# Patient Record
Sex: Female | Born: 1935 | Race: White | Hispanic: No | Marital: Single | Smoking: Former smoker
Health system: Southern US, Community
[De-identification: ages and names within clinical notes are randomized; demographics above are authoritative.]

## PROBLEM LIST (undated history)

## (undated) DIAGNOSIS — M810 Age-related osteoporosis without current pathological fracture: Secondary | ICD-10-CM

## (undated) DIAGNOSIS — J189 Pneumonia, unspecified organism: Secondary | ICD-10-CM

## (undated) DIAGNOSIS — N321 Vesicointestinal fistula: Secondary | ICD-10-CM

## (undated) DIAGNOSIS — M1990 Unspecified osteoarthritis, unspecified site: Secondary | ICD-10-CM

## (undated) DIAGNOSIS — N390 Urinary tract infection, site not specified: Secondary | ICD-10-CM

## (undated) DIAGNOSIS — J309 Allergic rhinitis, unspecified: Secondary | ICD-10-CM

## (undated) DIAGNOSIS — K5090 Crohn's disease, unspecified, without complications: Secondary | ICD-10-CM

## (undated) DIAGNOSIS — I739 Peripheral vascular disease, unspecified: Secondary | ICD-10-CM

## (undated) DIAGNOSIS — Z1639 Resistance to other specified antimicrobial drug: Secondary | ICD-10-CM

## (undated) DIAGNOSIS — R197 Diarrhea, unspecified: Secondary | ICD-10-CM

## (undated) DIAGNOSIS — I824Z9 Acute embolism and thrombosis of unspecified deep veins of unspecified distal lower extremity: Secondary | ICD-10-CM

## (undated) DIAGNOSIS — I10 Essential (primary) hypertension: Secondary | ICD-10-CM

## (undated) DIAGNOSIS — F0390 Unspecified dementia without behavioral disturbance: Secondary | ICD-10-CM

## (undated) DIAGNOSIS — K5900 Constipation, unspecified: Secondary | ICD-10-CM

## (undated) DIAGNOSIS — R34 Anuria and oliguria: Secondary | ICD-10-CM

## (undated) DIAGNOSIS — A0472 Enterocolitis due to Clostridium difficile, not specified as recurrent: Secondary | ICD-10-CM

## (undated) DIAGNOSIS — K219 Gastro-esophageal reflux disease without esophagitis: Secondary | ICD-10-CM

## (undated) DIAGNOSIS — D649 Anemia, unspecified: Secondary | ICD-10-CM

## (undated) DIAGNOSIS — A419 Sepsis, unspecified organism: Secondary | ICD-10-CM

## (undated) DIAGNOSIS — I872 Venous insufficiency (chronic) (peripheral): Secondary | ICD-10-CM

## (undated) HISTORY — PX: EYE SURGERY: SHX253

---

## 2004-12-07 ENCOUNTER — Ambulatory Visit: Payer: Self-pay | Admitting: Internal Medicine

## 2005-05-31 ENCOUNTER — Ambulatory Visit: Payer: Self-pay | Admitting: Internal Medicine

## 2005-11-30 ENCOUNTER — Ambulatory Visit: Payer: Self-pay | Admitting: Internal Medicine

## 2006-06-06 ENCOUNTER — Ambulatory Visit: Payer: Self-pay | Admitting: Internal Medicine

## 2006-11-14 ENCOUNTER — Inpatient Hospital Stay (HOSPITAL_COMMUNITY): Admission: EM | Admit: 2006-11-14 | Discharge: 2006-11-17 | Payer: Self-pay | Admitting: Emergency Medicine

## 2006-11-14 ENCOUNTER — Ambulatory Visit: Payer: Self-pay | Admitting: Cardiology

## 2006-11-14 ENCOUNTER — Ambulatory Visit: Payer: Self-pay | Admitting: Internal Medicine

## 2006-12-25 ENCOUNTER — Encounter: Payer: Self-pay | Admitting: Vascular Surgery

## 2006-12-25 ENCOUNTER — Ambulatory Visit (HOSPITAL_COMMUNITY): Admission: RE | Admit: 2006-12-25 | Discharge: 2006-12-25 | Payer: Self-pay | Admitting: Internal Medicine

## 2007-01-27 ENCOUNTER — Ambulatory Visit: Payer: Self-pay | Admitting: Family Medicine

## 2007-01-29 ENCOUNTER — Ambulatory Visit: Payer: Self-pay | Admitting: Internal Medicine

## 2007-01-29 LAB — CONVERTED CEMR LAB
Calcium: 9.6 mg/dL (ref 8.4–10.5)
Eosinophils Absolute: 0.1 10*3/uL (ref 0.0–0.6)
Eosinophils Relative: 0.8 % (ref 0.0–5.0)
GFR calc Af Amer: 78 mL/min
GFR calc non Af Amer: 64 mL/min
Glucose, Bld: 69 mg/dL — ABNORMAL LOW (ref 70–99)
Lymphocytes Relative: 14.5 % (ref 12.0–46.0)
MCV: 86.5 fL (ref 78.0–100.0)
Neutro Abs: 8.8 10*3/uL — ABNORMAL HIGH (ref 1.4–7.7)
Platelets: 677 10*3/uL — ABNORMAL HIGH (ref 150–400)
Potassium: 3.8 meq/L (ref 3.5–5.1)
Saturation Ratios: 16.3 % — ABNORMAL LOW (ref 20.0–50.0)
Sodium: 142 meq/L (ref 135–145)
WBC: 11.2 10*3/uL — ABNORMAL HIGH (ref 4.5–10.5)

## 2007-02-16 ENCOUNTER — Ambulatory Visit: Payer: Self-pay | Admitting: Internal Medicine

## 2007-03-01 ENCOUNTER — Ambulatory Visit: Payer: Self-pay | Admitting: Internal Medicine

## 2007-03-26 ENCOUNTER — Ambulatory Visit: Payer: Self-pay | Admitting: Internal Medicine

## 2007-04-12 ENCOUNTER — Ambulatory Visit: Payer: Self-pay | Admitting: Internal Medicine

## 2007-04-12 LAB — CONVERTED CEMR LAB
BUN: 15 mg/dL (ref 6–23)
Basophils Absolute: 0 10*3/uL (ref 0.0–0.1)
Creatinine, Ser: 0.7 mg/dL (ref 0.4–1.2)
Eosinophils Absolute: 0.1 10*3/uL (ref 0.0–0.6)
Lymphocytes Relative: 16.2 % (ref 12.0–46.0)
MCHC: 34.3 g/dL (ref 30.0–36.0)
MCV: 87.4 fL (ref 78.0–100.0)
Monocytes Relative: 5.6 % (ref 3.0–11.0)
Neutro Abs: 6.4 10*3/uL (ref 1.4–7.7)
Platelets: 395 10*3/uL (ref 150–400)

## 2007-05-02 ENCOUNTER — Ambulatory Visit: Payer: Self-pay | Admitting: Internal Medicine

## 2007-06-14 ENCOUNTER — Ambulatory Visit: Payer: Self-pay | Admitting: Internal Medicine

## 2007-07-18 ENCOUNTER — Telehealth: Payer: Self-pay | Admitting: *Deleted

## 2007-07-30 DIAGNOSIS — M1990 Unspecified osteoarthritis, unspecified site: Secondary | ICD-10-CM | POA: Insufficient documentation

## 2007-07-30 DIAGNOSIS — I10 Essential (primary) hypertension: Secondary | ICD-10-CM

## 2007-09-18 ENCOUNTER — Ambulatory Visit: Payer: Self-pay | Admitting: Internal Medicine

## 2007-09-18 DIAGNOSIS — I509 Heart failure, unspecified: Secondary | ICD-10-CM | POA: Insufficient documentation

## 2007-10-02 ENCOUNTER — Telehealth (INDEPENDENT_AMBULATORY_CARE_PROVIDER_SITE_OTHER): Payer: Self-pay | Admitting: *Deleted

## 2007-12-13 ENCOUNTER — Ambulatory Visit: Payer: Self-pay | Admitting: Internal Medicine

## 2008-02-14 ENCOUNTER — Telehealth: Payer: Self-pay | Admitting: Internal Medicine

## 2008-02-18 ENCOUNTER — Telehealth: Payer: Self-pay | Admitting: Internal Medicine

## 2008-02-21 ENCOUNTER — Telehealth: Payer: Self-pay | Admitting: Internal Medicine

## 2008-02-28 ENCOUNTER — Telehealth: Payer: Self-pay | Admitting: Internal Medicine

## 2008-03-03 ENCOUNTER — Telehealth: Payer: Self-pay | Admitting: *Deleted

## 2008-03-05 ENCOUNTER — Emergency Department (HOSPITAL_COMMUNITY): Admission: EM | Admit: 2008-03-05 | Discharge: 2008-03-05 | Payer: Self-pay | Admitting: Emergency Medicine

## 2008-03-09 ENCOUNTER — Inpatient Hospital Stay (HOSPITAL_COMMUNITY): Admission: EM | Admit: 2008-03-09 | Discharge: 2008-03-21 | Payer: Self-pay | Admitting: Emergency Medicine

## 2008-03-09 ENCOUNTER — Encounter (INDEPENDENT_AMBULATORY_CARE_PROVIDER_SITE_OTHER): Payer: Self-pay | Admitting: Surgery

## 2008-03-09 ENCOUNTER — Ambulatory Visit: Payer: Self-pay | Admitting: Pulmonary Disease

## 2008-03-13 ENCOUNTER — Telehealth: Payer: Self-pay | Admitting: Internal Medicine

## 2008-03-30 ENCOUNTER — Inpatient Hospital Stay (HOSPITAL_COMMUNITY): Admission: EM | Admit: 2008-03-30 | Discharge: 2008-04-15 | Payer: Self-pay | Admitting: Emergency Medicine

## 2008-03-30 ENCOUNTER — Ambulatory Visit: Payer: Self-pay | Admitting: Internal Medicine

## 2008-03-30 ENCOUNTER — Ambulatory Visit: Payer: Self-pay | Admitting: Pulmonary Disease

## 2008-04-04 ENCOUNTER — Ambulatory Visit: Payer: Self-pay | Admitting: Infectious Diseases

## 2008-04-22 ENCOUNTER — Inpatient Hospital Stay (HOSPITAL_COMMUNITY): Admission: EM | Admit: 2008-04-22 | Discharge: 2008-04-25 | Payer: Self-pay | Admitting: Emergency Medicine

## 2008-04-29 ENCOUNTER — Encounter: Payer: Self-pay | Admitting: Internal Medicine

## 2008-04-30 ENCOUNTER — Ambulatory Visit: Payer: Self-pay | Admitting: Internal Medicine

## 2008-09-07 IMAGING — CR DG CHEST 2V
2 series · 2 of 2 positions shown · non-contrast
Comparison: 03/15/2008

CLINICAL DATA: Shortness of breath and fever

CHEST - 2 VIEW

[w chest lat]
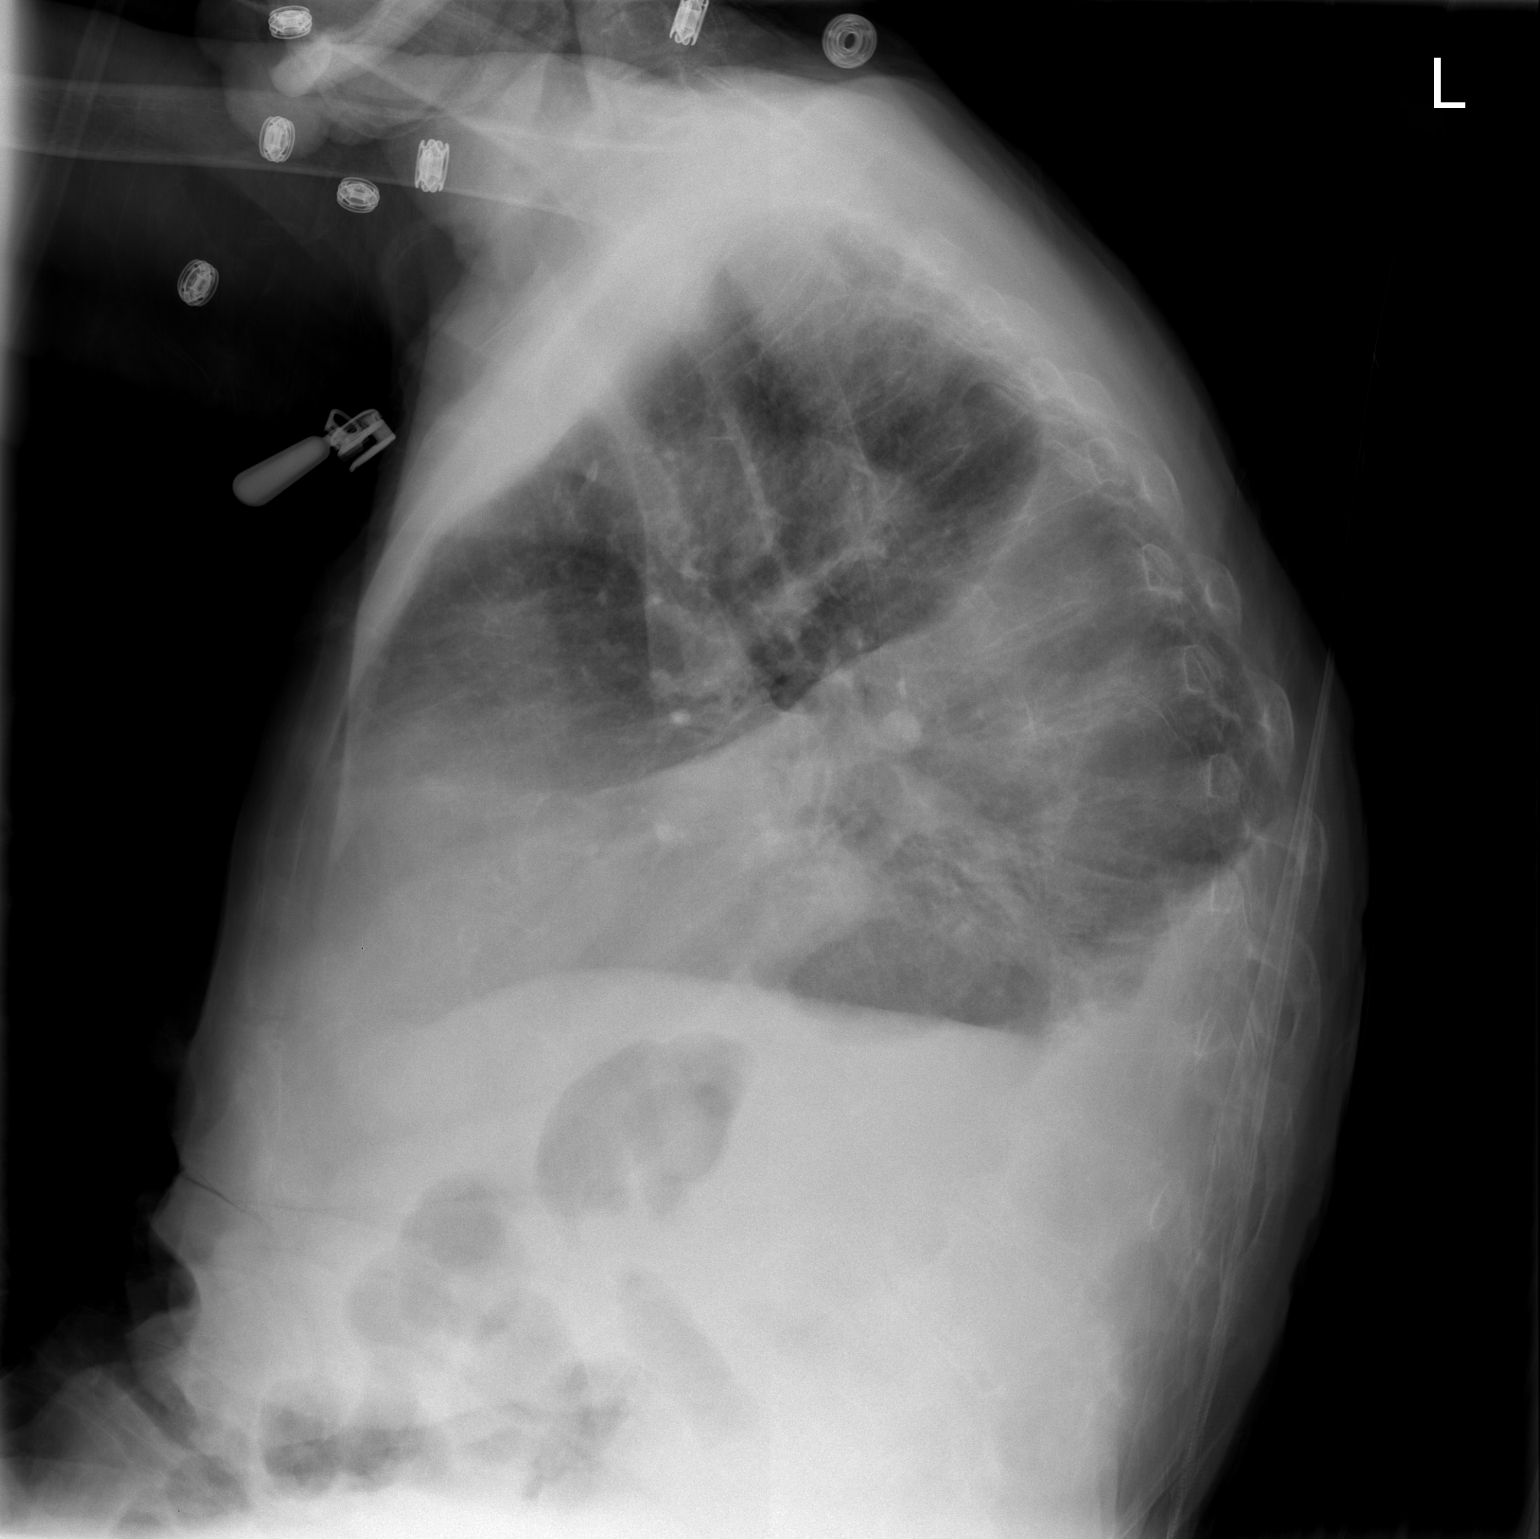

[view not recorded]
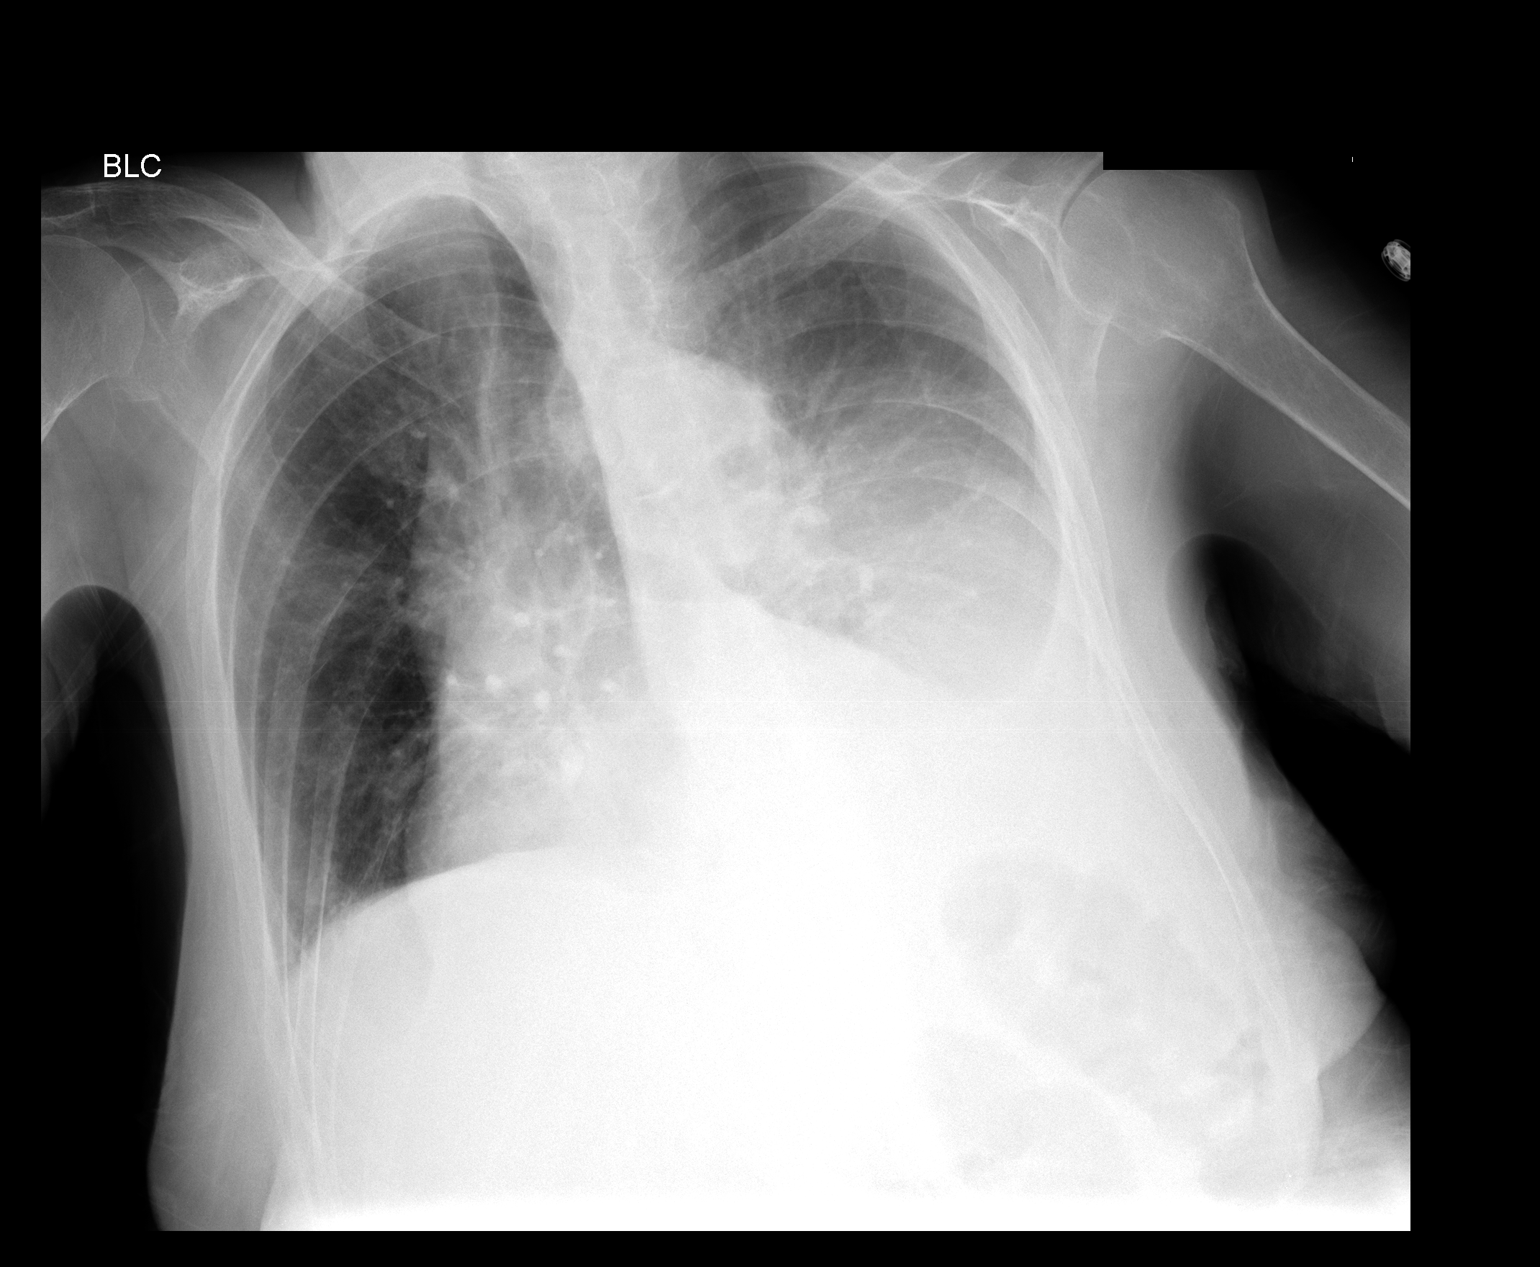

[2 of 2 positions shown; findings below may reference images not displayed]

FINDINGS: The heart is borderline enlarged.  There is tortuosity of
the thoracic aorta which is accentuated by the rotation of the
patient.  There is an enlarging left effusion with overlying
atelectasis or infiltrate.  No pulmonary edema.
IMPRESSION: 1.  Enlarging left effusion with overlying atelectasis or
infiltrate.

## 2008-09-08 IMAGING — CR DG ABD PORTABLE 1V
1 series · 1 of 1 positions shown · non-contrast
Comparison: 03/17/2008...

CLINICAL DATA: Pneumonia.  Evaluate ileus.  Abdominal pain.

ABDOMEN - 1 VIEW

[view not recorded]
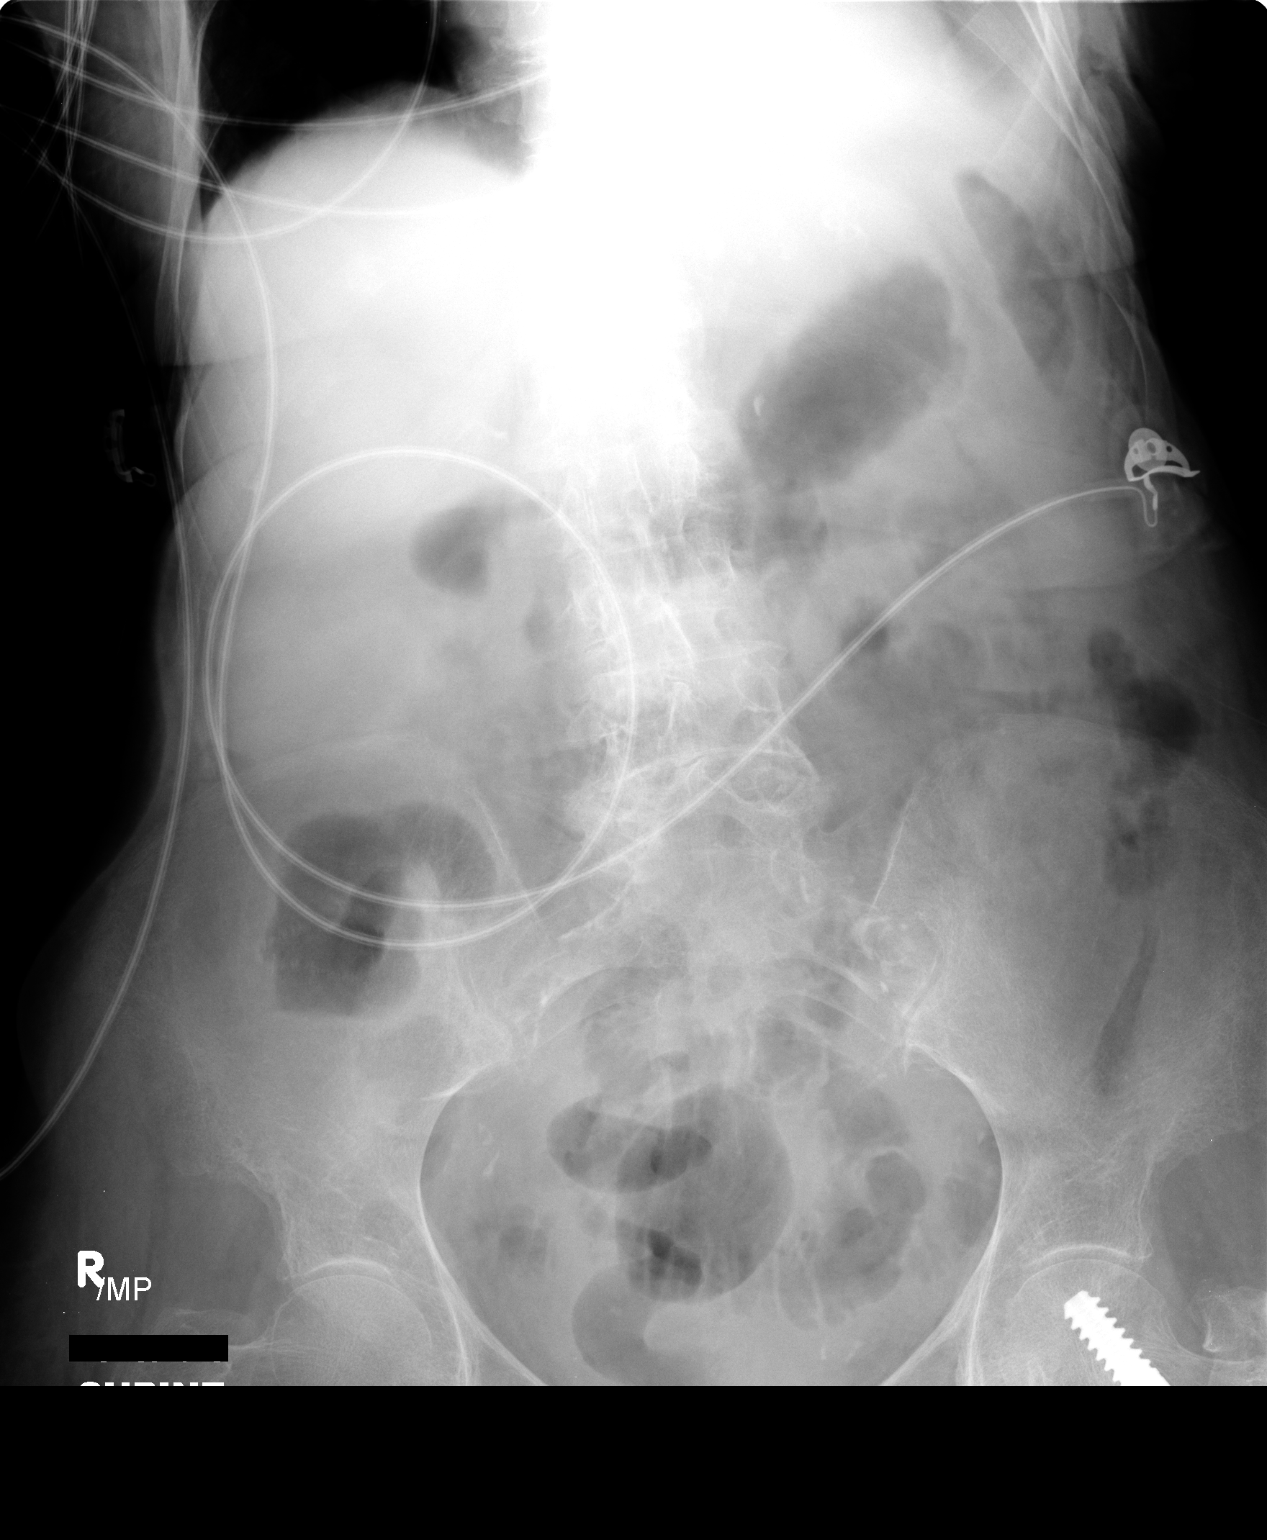

[1 of 1 positions shown; findings below may reference images not displayed]

FINDINGS: Non specific bowel gas pattern.  Findings suggestive of
partially gas filled prominent sized small bowel loops within the
pelvis.  It is possible this is related to mild ileus.  The
possibility of free air cannot be addressed on a supine view.
Degenerative changes lumbar spine..
IMPRESSION: Nonspecific bowel gas pattern.  Question slightly prominent sized
small bowel loops within the pelvis.  Significance indeterminate

## 2008-09-09 IMAGING — CR DG CHEST 1V
1 series · 1 of 1 positions shown · non-contrast
Comparison: Portable chest 04/01/2008 at 0530 hours.

CLINICAL DATA: Pneumonia.  Status post thoracentesis on the left.

CHEST - 1 VIEW

[view not recorded]
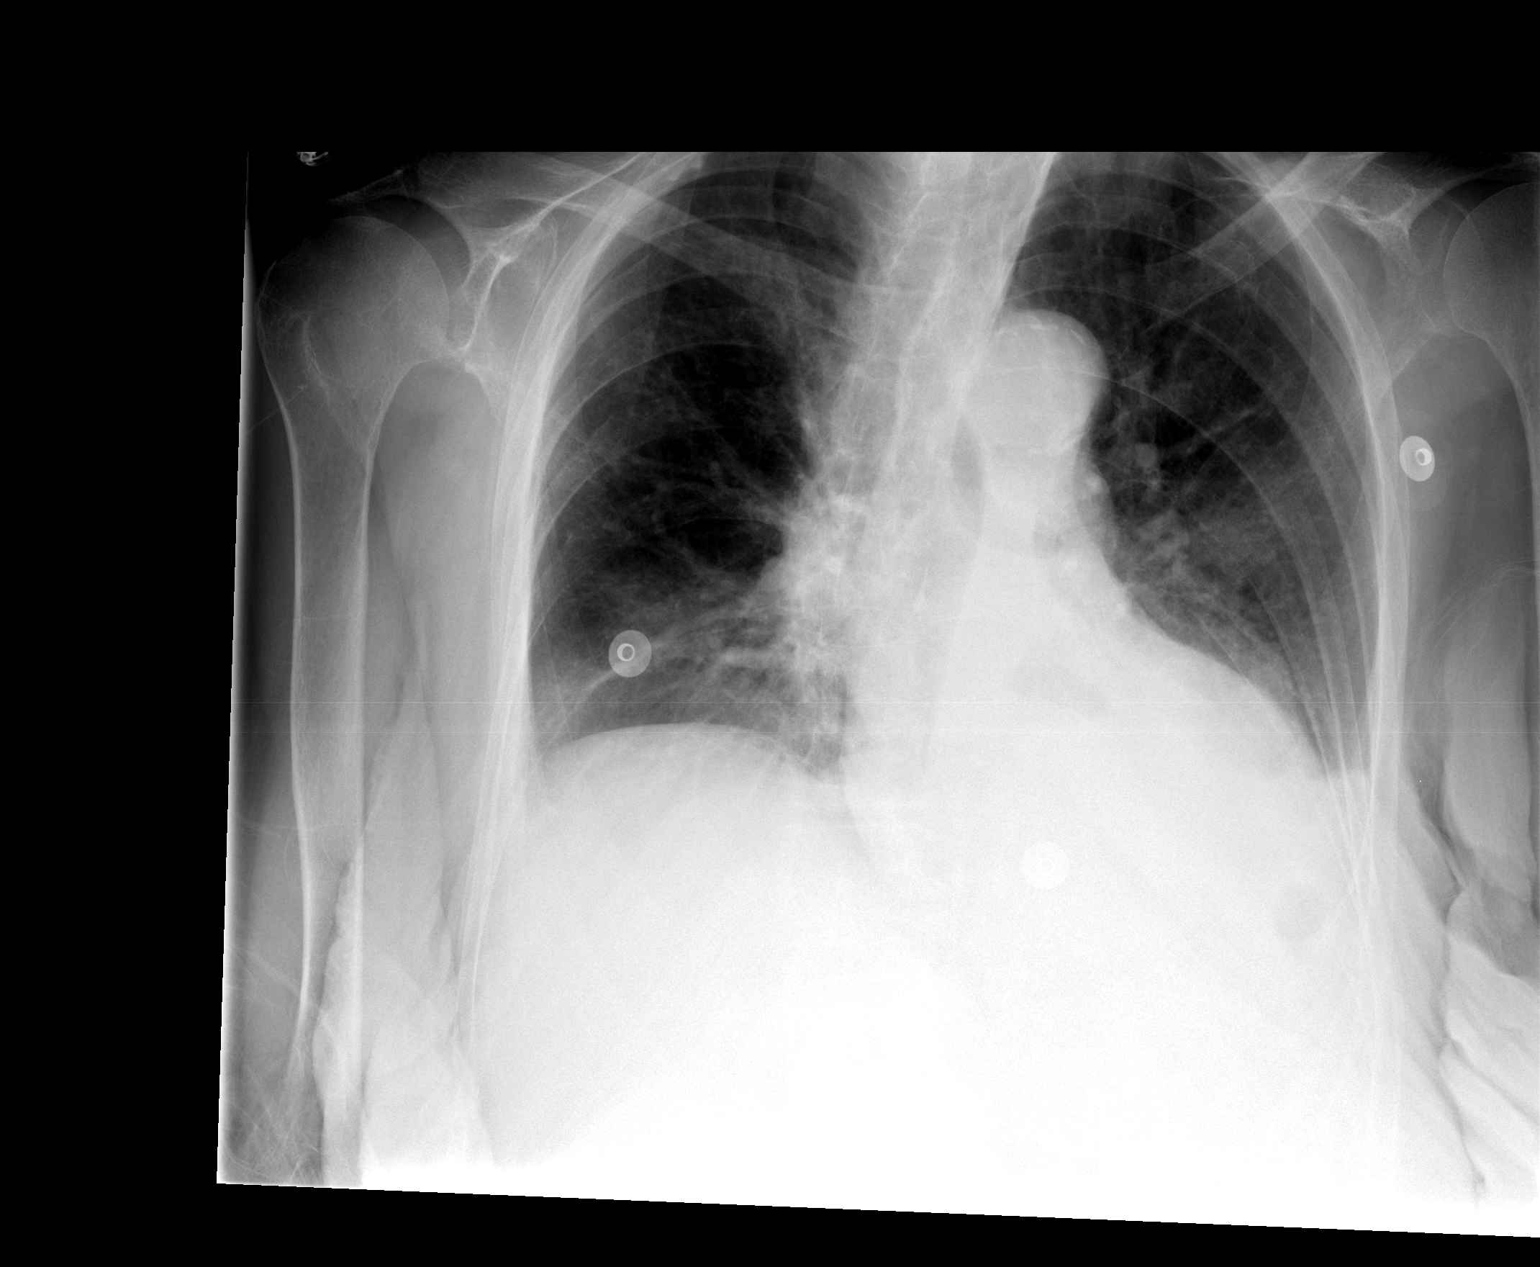

[1 of 1 positions shown; findings below may reference images not displayed]

FINDINGS: Left pleural effusion is much smaller after
thoracentesis. There may be a tiny left apical pneumothorax.
Examination is otherwise unchanged.
IMPRESSION: Marked decrease in left pleural effusion with possible tiny left
apical pneumothorax.

## 2008-10-03 IMAGING — XA IR [PERSON_NAME]/[PERSON_NAME]
1 series · 13 of 13 positions shown · non-contrast
Comparison: none

CLINICAL DATA: Extensive left lower extremity DVT.  The patient is
a poor candidate for anticoagulation and request is made to place
an IVC filter.
TECHNIQUE: The right neck was prepped with Betadine and draped.
Maximum barrier sterile technique was utilized for the procedure.

[Series 1000: run · 0.14mm/px · 13 of 13 slices shown]
[im 1/13]
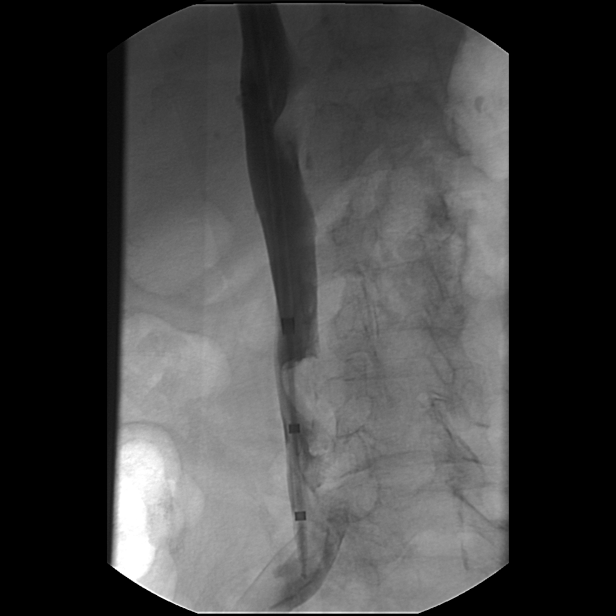
[im 2/13]
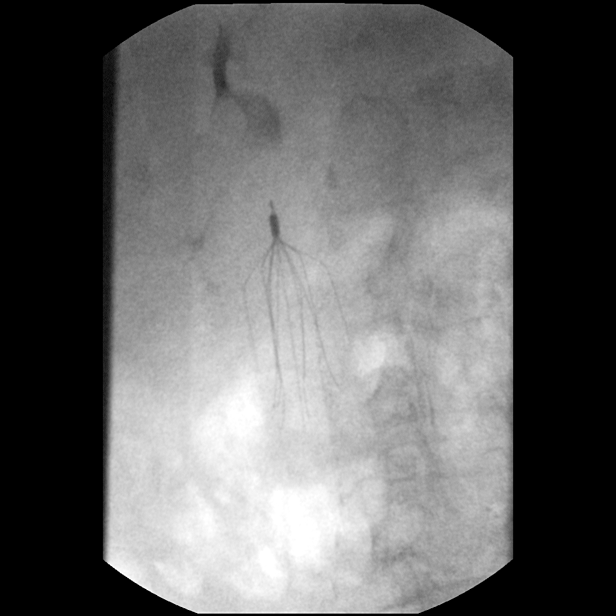
[im 3/13]
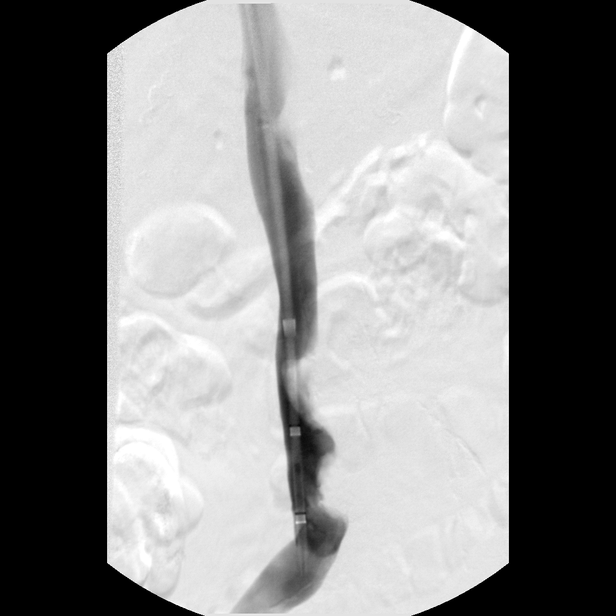
[im 4/13]
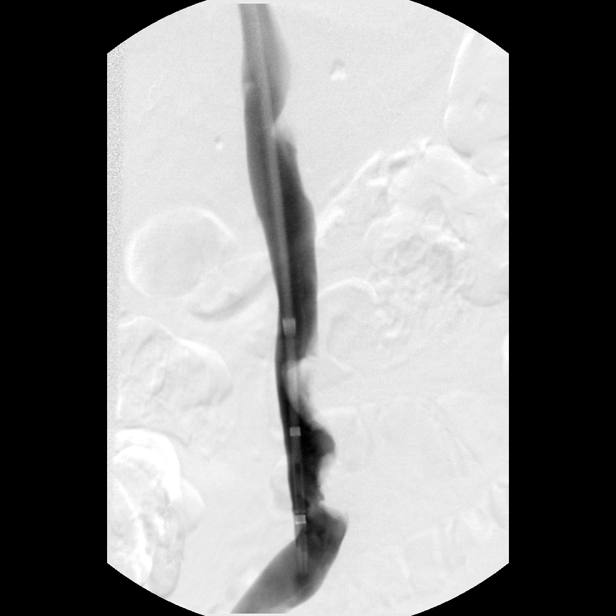
[im 5/13]
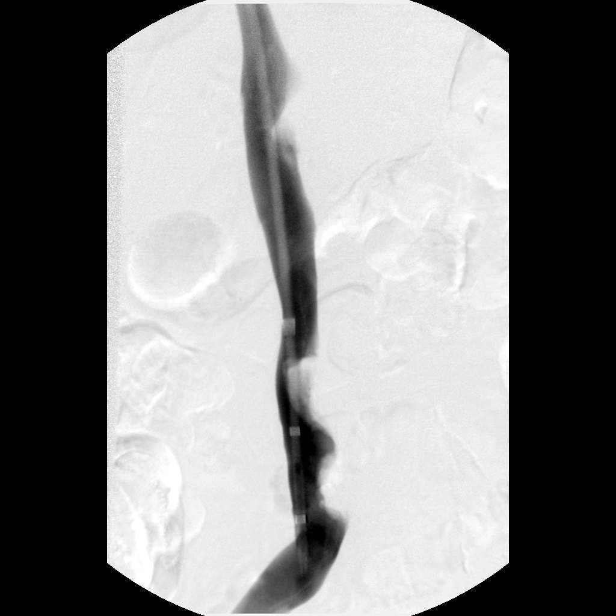
[im 6/13]
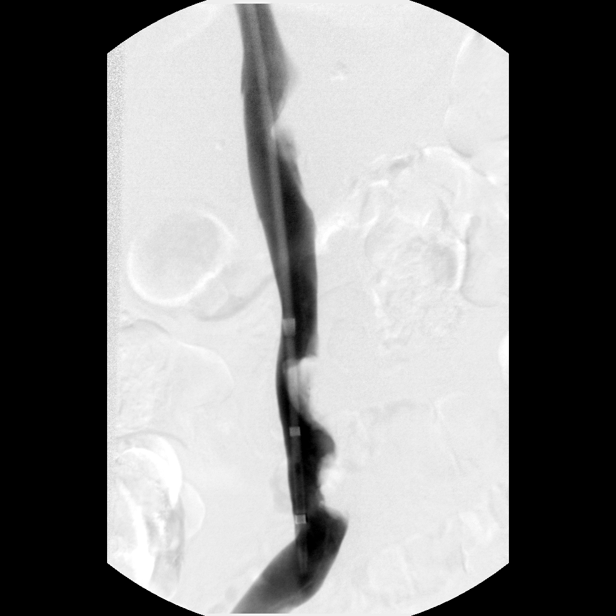
[im 7/13]
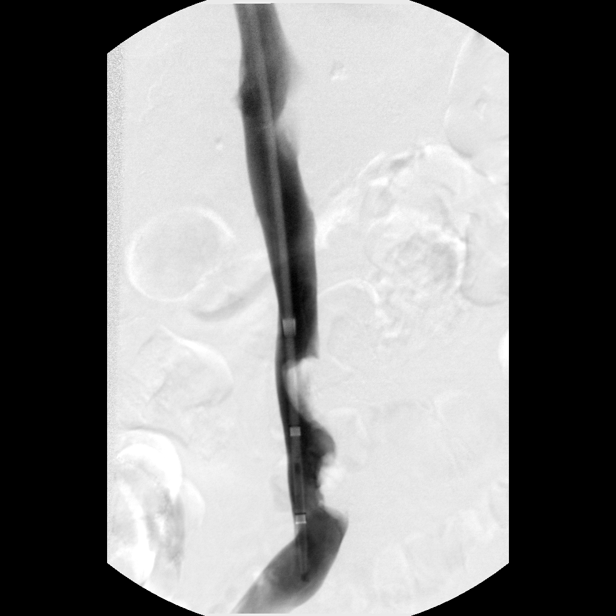
[im 8/13]
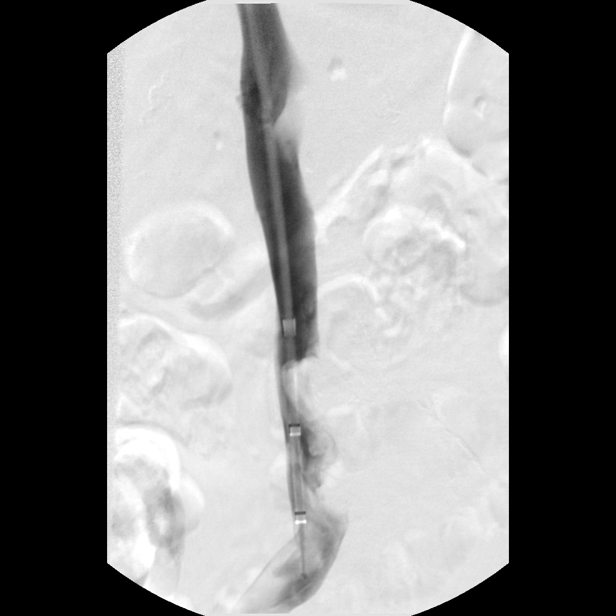
[im 9/13]
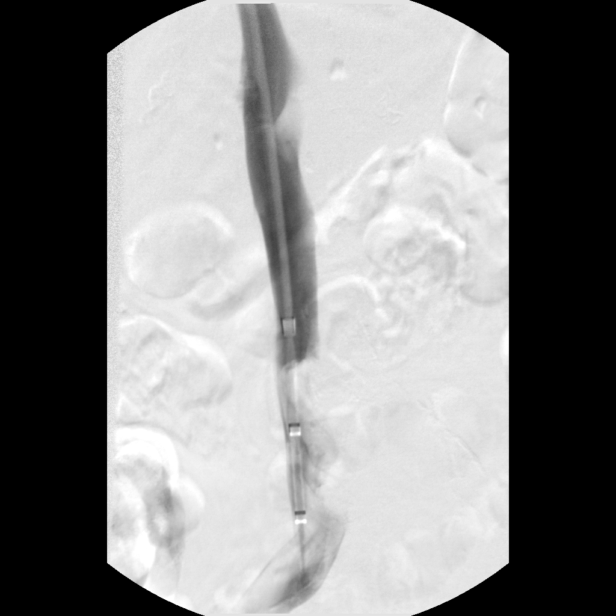
[im 10/13]
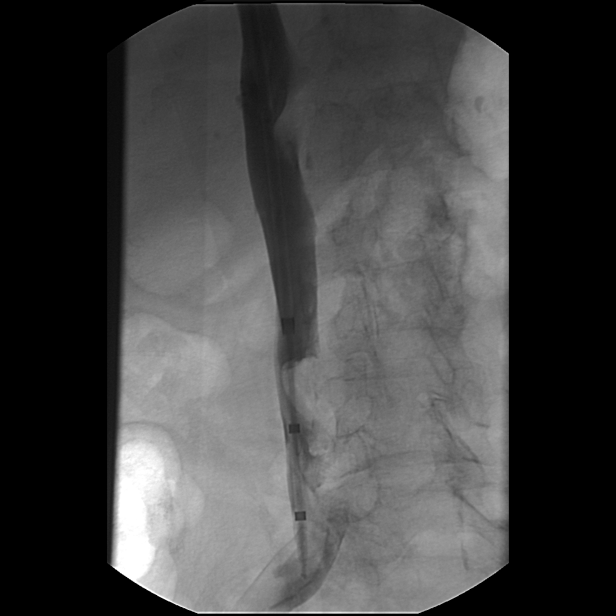
[im 11/13]
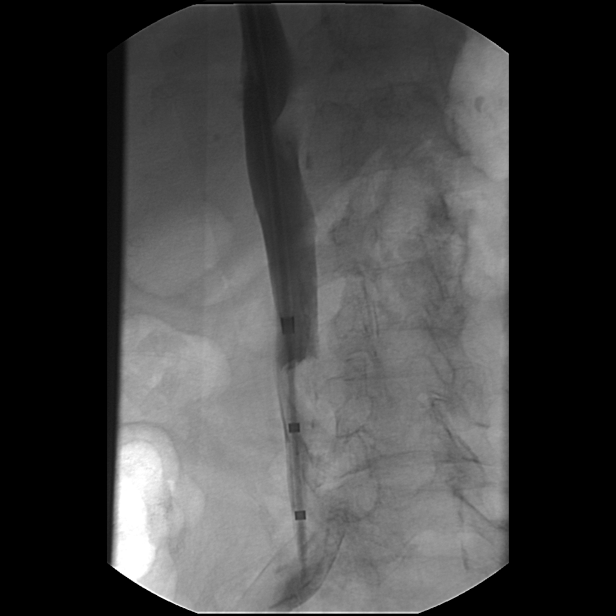
[im 12/13]
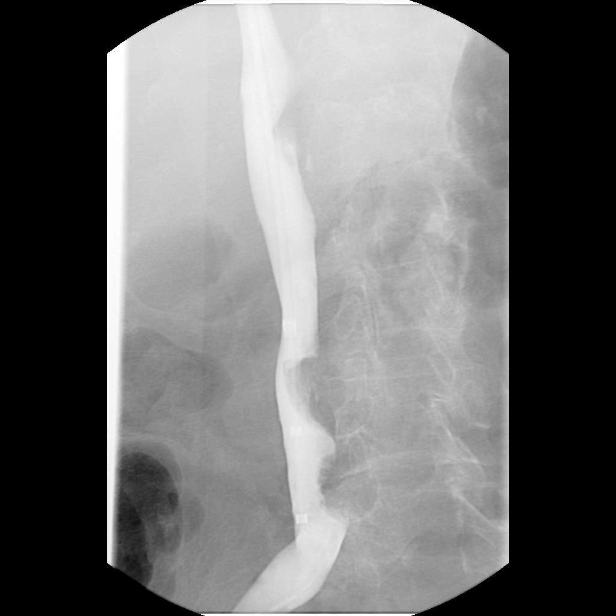
[im 13/13]
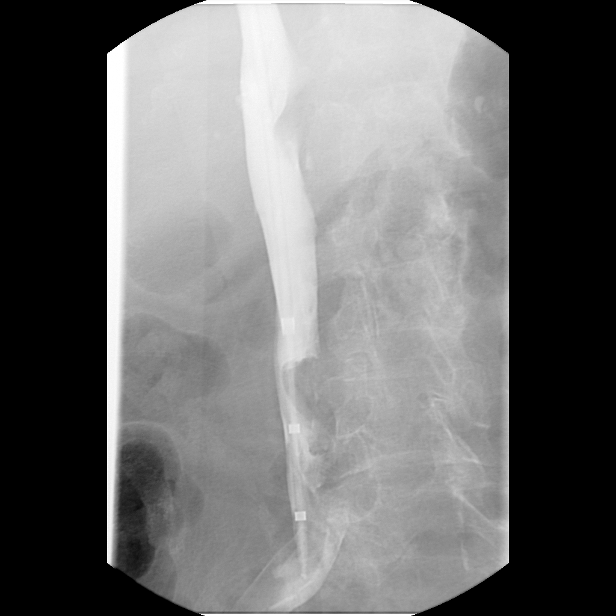

[13 of 13 positions shown; findings below may reference images not displayed]

1.  ULTRASOUND GUIDANCE FOR VASCULAR ACCESS OF THE RIGHT INTERNAL
JUGULAR VEIN.
2.  IVC VENOGRAM
3.  PERCUTANEOUS IVC FILTER PLACEMENT

Prior to the procedure written informed consent was obtained.

Sedation: 1.0mg IV Versed; 13mcg IV Fentanyl.

Total Moderate Sedation Time: 15 minutes.

Contrast Volume: 40cc

Fluoroscopy Time: 1.5 minutes.
A small skin incision was made.  A 21 gauge needle was then
advanced into the right internal jugular vein under direct
ultrasound guidance.   A guide wire was advanced into the inferior
vena cava under fluoroscopy.  A 10 French filter deployment sheath
was then advanced into the lower IVC.

IVC venogram was performed through the sheath dilator.  Reishiro Samuka2 X
IVC filter was chosen for placement.  This was advanced in the
deployment sheath.  After the sheath was positioned appropriately,
the percutaneous filter was deployed.  Final position was confirmed
with a fluoroscopic spot image.  Additional contrast injection was
performed under fluoroscopy to evaluate for flow through the filter
after placement.

After the procedure the sheath was removed and hemostasis obtained
with manual compression overlying the right neck.

Complications: None
FINDINGS: The IVC venogram demonstrates complete occlusive thrombus
in the left common iliac vein which protrudes into the lumen of the
lower IVC just above the common iliac vein confluence.  The
visualized proximal right common iliac vein is widely patent.
Above the level of protruding nonocclusive thrombus, additional
nonocclusive thrombus is also present along the left lateral wall
of the inferior vena cava.  This narrows the lumen of the inferior
vena cava at this level by nearly 70 - 80%.  The cava remains
patent, however.

Renal vein inflow was identified.  There was room to accurately
place an infrarenal IVC filter which was deployed without
difficulty and is in appropriate position.  The IVC is patent after
filter deployment.
IMPRESSION: Placement of infrarenal IVC filter.  As above, there is extensive
component of nonocclusive thrombus in the lower IVC protruding from
an occluded left common iliac vein and also adherent to the wall of
the IVC.  Flow was present through the narrowed segment of the
inferior vena cava.  The patient is at higher risk of IVC
thrombosis given findings.

## 2011-01-16 ENCOUNTER — Encounter: Payer: Self-pay | Admitting: Surgery

## 2011-05-10 NOTE — Discharge Summary (Signed)
[name], [name]              ACCOUNT NO.:  [id number]   MEDICAL RECORD NO.:  [id number]          PATIENT TYPE:  INP   LOCATION:  1539                         FACILITY:  [facility]   PHYSICIAN:  Thomas A. Cornett, M.D.DATE OF BIRTH:  [birth year]   DATE OF ADMISSION:  03/09/2008  DATE OF DISCHARGE:                               DISCHARGE SUMMARY   ADMISSION DIAGNOSES:  1. Sepsis.  2. Enterovesical fistula.  3. Osteoarthritis.  4. Mild senile dementia.  5. Hypertension.  6. Chronic lower extremity venous stasis disease.   DISCHARGE DIAGNOSES:  1. Sepsis.  2. Enterovesical fistula.  3. Osteoarthritis.  4. Mild senile dementia.  5. Hypertension.  6. Chronic lower extremity venous stasis disease.  7. Crohn disease.   PROCEDURES PERFORMED:  1. Exploratory laparotomy with resection of terminal ileum and cecum      with primary anastomosis.  2. Closure of enterovesical fistula.   BRIEF HISTORY:  The patient is a 77 year old female admitted from  [facility] on March 09, 2008, due to sepsis and stool in her  urine.  She was brought emergently to the operating room for exploratory  laparotomy.  Please see operative note for details.   HOSPITAL COURSE:  The patient was placed in the ICU postop on inotropic  support.  Over the next 3 days she improved.  Her creatinine had bumped  up into the 3.0 region, and this improved on its own.  Her urine output  cleared.  Her urine was clear with a Foley in place and a JP drain in  her pelvis was clear.  By postoperative day #5 she was transferred out  of the intensive care unit to the floor.  Her bowel function return with  some diarrhea, and this was controlled with Imodium.  Her diet was  advanced, which she tolerated very well.  The wound was left open and  dressing changes were used to clean his up, which did a good job.  She  used Unna boots in the nursing facility, and these were replaced.  She  was working with PT and OT and doing  very well there.  She was  discharged back to [facility] in improved condition on March 21, 2008.   DISCHARGE INSTRUCTIONS:  She will follow up with me in 1 week.  Her  Foley will catheter stay in place.  Her JP drain was removed today.  She  will need to participate with physical therapy and occupational therapy  with no restrictions.  The Unna boots will be changed weekly.  She will  need wet-to-dry dressing changes to her abdominal incision b.i.d.   MEDICATIONS AT DISCHARGE:  1. Cardizem CD 240 mg daily.  2. Ultram 50 mg daily.  3. Potassium potassium by mouth 15 mL daily.  4. Mobic 7.5 mg t.i.d.  5. Moexipril 7.5 mg daily.  6. Lasix 40 mg daily.  7. Milk of magnesia 30 mL by mouth as needed for constipation.  8. Colace 100 mg as needed daily.  9. Darvocet-N 100 1-2 tablets every 12 hours as needed for pain.  10.Cipro 500 mg  p.o. b.i.d.   She has no restrictions.  The Foley catheter just needs to go to gravity  drainage.  Her diet will be a regular diet as tolerated.  She will need  a Boost supplement 1 can p.o. t.i.d.   CONDITION AT DISCHARGE:  Improved.      Thomas A. Cornett, M.D.  Electronically Signed     TAC/MEDQ  D:  03/21/2008  T:  03/21/2008  Job:  [id number]   cc:   Stacie Glaze, MD  [address] [place]  [place] [address]

## 2011-05-10 NOTE — Discharge Summary (Signed)
[name], [name]              ACCOUNT NO.:  [id number]   MEDICAL RECORD NO.:  [id number]          PATIENT TYPE:  INP   LOCATION:  1408                         FACILITY:  [facility]   PHYSICIAN:  Bruce Rexene Edison. Swords, MD    DATE OF BIRTH:  Nov 14, 1926   DATE OF ADMISSION:  04/22/2008  DATE OF DISCHARGE:  04/25/2008                               DISCHARGE SUMMARY   REFERRING PHYSICIAN:  Vikki Ports A. Felicity Coyer, M.D.   DISCHARGE DIAGNOSES:  1. Extensive left lower extremity deep vein thrombosis in setting of      recent prolonged hospitalization.  2. Diarrhea.   HISTORY OF PRESENT ILLNESS:  [name] is a very pleasant 77 year old  white female with recent admission from April 5 to April 15, 2008, with  multifactorial sepsis in setting of C. diff colitis, left lower lobe  pneumonia and VRE UTI.  Patient noted to have extensive left lower  extremity swelling; however denied any pain or shortness of breath.  Patient was sent for ultrasound which revealed an extensive left lower  extremity DVT with absence of venous flow.  Patient was admitted at that  time for anticoagulation treatment and consideration of IVC filter.   PAST MEDICAL HISTORY:  1. Multifactorial sepsis as mentioned in HPI.  2. C. diff colitis.  3. VRE UTI.  4. Iron-deficiency anemia.  5. Pelvic abscess status post exploratory lap on March 10, 2008.  6. Intravesicular fistula status post repaired and resection of      terminal ileum and cecum March 10, 2008.  7. Hypertension.  8. Crohn's disease.  9. Osteoarthritis.  10.Peripheral vascular disease.  11.Dementia.  12.Bilateral lower extremity venostasis disease.  13.Constipation.  14.Compression fractures of back.   COURSE OF HOSPITALIZATION:  1. The patient admitted and placed on full dose Lovenox therapy.  Also      of note, patient with past history of postop DVTs.  Patient is very      high fall risk secondary to advanced age and chronic gait      disturbance,  therefore not and ideal candidate for chronic      anticoagulation.  Interventional Radiology asked to see patient in      consultation.  They evaluated patient on April 30th.  IVC filter      was placed on Apr 25, 2008.  Patient tolerated procedure without any      difficulties.  However, given patient's significant clot burden      during this hospitalization, patient at high risk for IVC      thrombosis at level of filter should a large amount of the thrombus      migrate into the filter; however, the IVC filter was felt necessary      in order to avoid acute PE.  2. Diarrhea.  Patient's with history of C. diff colitis, C. diff toxin      checked during this admission and was negative.  Patient's diarrhea      has resolved.  Imodium to be used p.r.n.   MEDICATIONS AT TIME OF DISCHARGE:  1. Diltiazem 240 mg p.o. daily.  2. Potassium chloride 20 mEq p.o. daily.  3. Lasix 60 mg p.o. daily.  4. Spironolactone 25 mg p.o. daily.  5. Protonix 40 mg p.o. daily.  6. Senokot 1 tab p.o. daily.  7. Ferrous sulfate 325 mg p.o. b.i.d.  8. Avapro 150 mg p.o. daily.  9. Ultram 50 mg p.o. daily.  10.Ultram 50 mg p.o. b.i.d. p.r.n.  11.Tylenol 650 mg p.o. every 4 hours p.r.n.  12.Milk of Magnesia 30 mL p.o. daily as needed.  13.Nutritional supplement Resource Shake p.o. b.i.d.   PHYSICAL EXAMINATION:  At time of discharge, blood pressure 113/63.  Pulse 90.  Respirations 20.  Temperature 97.8.  O2 sat is 98% on room  air.  GENERAL:  This is an elderly very delightful white female, alert in no  acute distress.  HEENT:  Pupils are equal, round and reactive to light.  Moist mucous are  membranes.  No scleral icterus or injection.  NECK:  Supple without any thyromegaly or lymphadenopathy.  No JVD or  carotid bruits.  CHEST:  With symmetrical movement, decreased inspiratory effort.  Lungs  sounds clear to auscultation bilaterally.  No wheezes, rales or  crackles.  CARDIOVASCULAR:  S1, S2, regular  rate and rhythm.  ABDOMEN:  Soft, nontender, nondistended with positive bowel sounds.  Surgical incision healing within normal limits.  EXTREMITIES:  Left lower extremity with 2-3+ edema and 2+ DP pulse.  SKIN:  Without any suspicious rashes or lesions.  NEUROLOGIC:  Patient is awake and alert, oriented to name and place  which is baseline for her.   DISPOSITION:  Patient felt medically stable for discharge to skilled  nursing facility at this time where she is to continue PT and OT as  prior to this hospitalization.  Patient can followup with her primary  care physician Dr. Marga Melnick as needled.      Bruce Rexene Edison Swords, MD  Electronically Signed     BHS/MEDQ  D:  04/25/2008  T:  04/25/2008  Job:  [id number]   cc:   Titus Dubin. Alwyn Ren, MD,FACP,FCCP  [phone] [place]  [place] [address]

## 2011-05-10 NOTE — Op Note (Signed)
[name], [name]              ACCOUNT NO.:  [id number]   MEDICAL RECORD NO.:  [id number]          PATIENT TYPE:  INP   LOCATION:  1227                         FACILITY:  [facility]   PHYSICIAN:  Clovis Pu. Cornett, M.D.DATE OF BIRTH:  10-03-1926   DATE OF PROCEDURE:  03/10/2008  DATE OF DISCHARGE:                               OPERATIVE REPORT   PREOPERATIVE DIAGNOSES:  1. Sigmoid diverticulitis.  2. Perforated viscus.  3. Pelvic abscess.  4. Colovesical fistula.   POSTOPERATIVE DIAGNOSES:  1. Perforated distal ileum.  2. Ileovesical fistula.  3. Pelvic abscess.  4. Multiple small bowel and jejunal diverticula.   PROCEDURE:  1. Exploratory laparotomy with resection of terminal ileum and cecum      and primary anastomosis.  2. Drainage of pelvic abscess.  3. Closure of ileovesical fistula.   SURGEON:  Maisie Fus A. Cornett, M.D.   ASSISTANT:  Cherylynn Ridges, M.D.   ANESTHESIA:  General endotracheal anesthesia.   ESTIMATED BLOOD LOSS:  250 mL.   DRAINS:  19 Blake drain to pelvis.   INDICATIONS FOR PROCEDURE:  The patient is a 77 year old female who  presents from a nursing home to the emergency room with dysuria and  abdominal pain.  Workup revealed stool in her urine.  CT scan was then  obtained which showed a large pelvic abscess and what appeared to be  perforated sigmoid diverticulitis with free fluid in the abdomen and a  white count of 30,000.  After examining her and reviewing her films, I  felt that emergent exploration was needed for concern for peritonitis.  She also was hypotensive in the emergency room.  She presents emergently  for this.  Informed consent was obtained from her nephew who is her  power of attorney.   DESCRIPTION OF PROCEDURE:  The patient was brought to the operating room  and placed supine.  After induction of general anesthesia, the abdomen  was prepped and draped in a sterile fashion.  Foley catheter was already  in place draining stool.   A midline incision was used.  We dissected through a midline opening in  the fascia and entered the abdominal cavity.  Upon entering the pelvis,  there was a large amount of succus entericus and a large abscess in the  pelvis, so I evacuated it, and using my hand, I took adhesions down  bluntly.  The ileum was densely adherent to the bladder, and this was  the fistula connection.  I used my finger to fracture the ileum off the  bladder.  The defect in the bladder could be seen where it was attached  to the ileum.  This did not appear be Crohn's disease.  It was unclear  to me if she had diverticulum there because she had multiple other small  bowel diverticula that had perforated and then eroded into the bladder  or if she had tumor there.  In any event, I went ahead and closed the  bladder using 3-0 Vicryl.  The bladder itself looked relatively normal.  This appeared to be more inflammatory than malignant.  There was a  copious amount of succus entericus in the pelvis, and I suctioned this  out.  At this point, I ran the small bowel from the ligament of Treitz  to the cecum.  There were multiple jejunal diverticula actually down  into the ileum itself.  We then mobilized the cecum up and did a limited  resection of the disease area of the terminal ileum which was about 10-  15 cm from the ileocecal valve.  A GIA 75 stapling device was fired  across the small bowel proximal to that in an area that looked  relatively healthy.  We then divided the colon just distal to the cecum  as well.  The mesentery had numerous large lymph nodes, and we tried to  take some these with the specimen and resected the specimen with the  LigaSure.  This was passed off the field.  We then created a side-to-  side anastomosis of the small bowel to the ascending colon with a GIA 75  stapling device.  A TA50 was used to close the enterotomy, and I  oversewed part of this with 3-0 silk pop offs.  The bowel was not   twisted and laid nicely.  We then irrigated out the pelvis with 4 liters  of saline until clear.  I reexamined the bladder closure.  It appeared  to be well closed.  I saw no leakage of urine.  I could feel the Foley  catheter.  The descending colon and sigmoid colon actually appeared  quite normal.  There were diverticula, but it was soft.  No evidence of  inflammation or perforation or leakage of stool from this area that we  could see.  The remainder of her colon was relatively unremarkable.  The  liver was palpated and felt to be normal.  The gallbladder was palpated,  and I felt no stones.  The jejunum was in the stomach.  At this point in  time, I placed a 19 Blake drain in the pelvis just behind the bladder.  I secured it to the skin in the left lower quadrant with 3-0 nylon.  After the irrigation was suctioned out, the fascia was then closed with  a running #1 PDS.  I placed three staples to approximate the skin after  irrigating out the subcu tissues and packed the remainder of the wound  open.  All final counts of sponge, needle, and instruments were found to  be correct at this portion of the case.   The patient was then awakened and taken to recovery in satisfactory  condition.      Thomas A. Cornett, M.D.  Electronically Signed     TAC/MEDQ  D:  03/10/2008  T:  03/10/2008  Job:  [id number]   cc:   Stacie Glaze, MD  [address] [place]  [place] [address]

## 2011-05-10 NOTE — Discharge Summary (Signed)
[name], [name]              ACCOUNT NO.:  [id number]   MEDICAL RECORD NO.:  [id number]          PATIENT TYPE:  INP   LOCATION:  1428                         FACILITY:  [facility]   PHYSICIAN:  Willow Ora, MD                DATE OF BIRTH:   DATE OF ADMISSION:  03/30/2008  DATE OF DISCHARGE:  04/15/2008                               DISCHARGE SUMMARY   DISCHARGE DIAGNOSES:  1. Sepsis on admission multifactorial in nature.  2. Clostridium difficile colitis.  3. Left lower lobe pneumonia with pleural effusion.  4. Vancomycin-resistant enterococcus urinary tract infection.  5. Oliguria.  6. Iron-deficiency anemia.  7. Recent pelvic abscess status post exploratory laparotomy on March 10, 2008 at [facility].   HISTORY OF PRESENT ILLNESS:  Ms. [name] is a very pleasant 77 year old  who lives at [facility] and recently discharged from  [facility] on March 21, 2008 status post resection of terminal  ileum and cecum with primary anastomosis and with closure of  enterovesical fistula.  The patient transported from nursing facility on  day of admission with nursing home reports of fever, tachycardia,  hypoxia and hypotension.  The patient reported she was feeling well upon  transfer to the emergency room.  However, EMS found her O2 sats to be  89%.  The patient did have some nausea.  Denied any vomiting. Also  reported diarrhea on and off.  She was discharged from the hospital.  Further evaluation in the emergency room revealed a temperature of  103.0.  Chest x-ray with left effusion, atelectasis versus infiltrate.  EKG normal sinus rhythm with tachycardia.  Urinalysis with positive  leukocyte esterase, 11-20 WBCs and with 21-50 RBCs.  The patient was  admitted at that time for further evaluation and treatment.   PAST MEDICAL HISTORY:  1. 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  2. Hypertension.  3. Crohn's disease.  4. Osteoarthritis.  5. Peripheral vascular disease.  6. Dementia.  7. Bilateral lower extremity venostasis disease.  8. Constipation.  9. Compression fractures of lower back.   HOSPITAL COURSE:  1. Sepsis.  As mentioned above, the patient's sepsis is multifactorial      in nature.  The patient admitted from emergency room to intensive      care unit for close evaluation.  She was placed on broad-spectrum      antibiotics at time of admission with Zosyn.  However,  further      evaluation revealed that stool positive for C. diff and Flagyl      started at that time. However, treatment complicated by concurrent      Zosyn therapy for other illnesses.  Therefore, the patient on      prolonged dose of Flagyl to be continued 2 weeks  post-discharge.  At time of discharge, the patient without any diarrhea  x 24 hours.  __________ was placed in ICU until  frequent loose stool had  resolved.  In addition to C. diff colitis, the patient's urine culture  positive for VRE sensitive to Zyvox.  The patient treated with total of  7 days Zyvox.  Antibiotic therapy  was ordered and consultation with  interventional __________ was straining from previous fistula and with  C. diff.  Again, Zosyn continued for a total of 11 days for probable  pneumonia.  The patient with questionable lung abscess on films,  however, this has been ruled out by CT.  As mentioned above, the patient  with pneumonia in addition to bilateral pleural effusions.  The patient  did undergo thoracentesis on April 01, 2008 drawing off 980 cc of fluid  with negative cytology.  The patient again underwent left thoracentesis  on April 10, 2008 with 1 liter of clear yellow fluid drawn off.  Again  negative cytology.  Pulmonary medicine was asked to consult due to the  patient's recurrent effusions.  CT of the chest did not indicate any  empyema.  Pulmonary did not feel chest tube  was necessary at which time  the patient underwent her second thoracentesis.  The patient has  remained stable post-thoracentesis on April 10, 2008 with no  complications after procedure.  1. Oliguria with normal renal function.  No indication of obstruction.      Urine output improved with aggressive IV fluids and antibiotic      therapy.  As noted above, the patient maintained normal renal      function throughout hospitalization.  2. Iron-deficiency anemia.  Iron level obtained during this admission      less than 10.  The patient started on p.o. iron supplementation.   DISCHARGE MEDICATIONS:  1. Cardizem CD 240 mg p.o. daily.  2. Ultram 50 mg p.o. daily.  3. Lasix 40 mg p.o. daily.  4. Potassium chloride 20 mEq p.o. daily.  5. Protonix 40 mg p.o. daily.  6. Flagyl 500 mg p.o. t.i.d. until April 23, 2008.  7. Ferrous sulfate 325 mg p.o. b.i.d.  8. Avapro 150 mg p.o. daily.  9. Nutritional supplement (Resource) p.o. b.i.d.  10.Tylenol 650 mg p.o. q.4 h. p.r.n.  11.Tramadol 50 mg p.o. b.i.d. p.r.n.  12.__________ 100 mg p.o. daily as needed.  13.Milk of magnesia 30 cc p.o. daily as needed.   PHYSICAL EXAMINATION:  VITAL SIGNS at time of discharge:  Blood pressure  145/73, heart rate 92, respirations 19, temperature 97.8, O2 saturation  is 93% on room air.  GENERAL:  This is an elderly, very pleasant white female alert and  oriented in no acute distress.  HEAD/EYES/EARS/NOSE/THROAT:  Pupils are equal, round, reactive to light.  Mucous membranes are moist.  No scleral icterus or injection.  NECK:  Thin, supple, no thyromegaly or lymphadenopathy.  No JVD or  carotid bruits.  CHEST:  Symmetrical movement, decreased inspiratory effort with  diminished breath sounds in lower lobes bilaterally.  No increased work  of breathing.  No wheezes, rales or crackles.  HEART:  S1-S2, regular rate and rhythm.  ABDOMEN:  Soft, nontender, nondistended with positive bowel sounds.  Surgical wound  healing within normal limits without any discharge or  fluctuance.  EXTREMITIES:  With 1-2+ chronic edema bilaterally to lower extremities  with chronic venous changes.  NEUROLOGIC:  The patient without any focal motor or sensory deficits on  exam.   LABORATORY DATA:  Sodium 139, potassium 3.5, BUN 2, creatinine 0.49, TSH  2.727, albumin 1.3.  Anemia panel:  Iron  less than 10, TIBC and percent  saturation not calculated secondary to low iron, vitamin B12 293, folate  4.4, ferritin 777.   DISPOSITION:  The patient with prolonged very complicated  hospitalization.  However, has responded very well to antibiotic therapy  and other medications.  The patient felt medically stable at this time  for discharge to skilled nursing facility where she can continue  physical therapy and occupational therapy as prior to this admission.   FOLLOW UP:  The patient is instructed to follow up with her primary care  physician Dr. Darryll Capers on April 21, 2008 at 3:45 p.m. or sooner if  needed.      Cordelia Pen, NP      Willow Ora, MD  Electronically Signed    LE/MEDQ  D:  04/15/2008  T:  04/15/2008  Job:  [id number]   cc:   Stacie Glaze, MD  [address] [place]  [place] [address]   Clovis Pu. Cornett, M.D.  [address] [place] [address]  [place] [address]

## 2011-05-10 NOTE — H&P (Signed)
Megan, Mcclure              ACCOUNT NO.:  [id number]   MEDICAL RECORD NO.:  [id number]          PATIENT TYPE:  INP   LOCATION:  0104                         FACILITY:  [facility]   PHYSICIAN:  Titus Dubin. Hopper, MD,FACP,FCCPDATE OF BIRTH:  30-May-1926   DATE OF ADMISSION:  04/22/2008  DATE OF DISCHARGE:                              HISTORY & PHYSICAL   HISTORY OF PRESENT ILLNESS:  [name] is an 77 year old white  female admitted with extensive deep venous thrombosis in the left lower  extremity.   She was hospitalized March 30, 2008 to April 15, 2008 with sepsis;  Clostridium difficile colitis; left lower lobe pneumonia with effusion;  and vancomycin-resistant enterococcus urinary tract infection.   She had had a recent pelvic abscess which she had exploratory laparotomy  on March 10, 2008, at [facility].   The patient has been  in [facility] since April 21,  200.  Apparently she is under the care of  Dr. Jarome Matin at that  facility.  Dr. Timothy Lasso apparently was contacted by [facility] concerning the  persistent edema of the left lower extremity.  He ordered a venous  Doppler which showed extensive clot and began Lovenox.   It was his impression that she had only been at [facility] four days and  therefore was not established as a patient.  Dr.Russo requested the  emergency room doctor call [facility].   She is now admitted for ongoing Lovenox therapy and consideration of  possible intravascular umbrella placement in the inferior vena cava.   Her past medical history is long and complicated.  She had an  intravesicular fistula which necessitated resection of the terminal  ileum and cecum.  This was complicated by a pelvic abscess.   She has longstanding hypertension; Crohn's disease; osteoarthritis;  peripheral vascular disease; venous stasis disease; and osteoporosis  with vertebral collapse.   She has a past history of hip fracture  complicated by deep venous  thrombosis as well.   She had an intratrochanteric fracture of the left hip in November 2007  treated by Dr. Thomasena Edis.   The operative report of March 10, 2008 revealed sigmoid diverticulitis  with perforated viscus and pelvic abscess and colovesical fistula.  This  was performed by Dr. Luisa Hart.   She does have the past history of venous insufficiency for which  bilateral Unna boots have been employed, most recently in March of this  year.   She has a history of chronic congestive heart failure.   The old chart also lists a diagnosis of dementia.   The family history reveals coronary artery disease in first-degree female  relatives less than 79 years of age.  There is also a history of stroke  in her mother.  There is also a mention of hypertension and diabetes in  the family.   She is a former smoker.  She does not drink.  She denies any drug  allergies.   Office notes from Dr. Darryll Capers were copied and reviewed.  The notes  reveal that she was on Coumadin following the repair of  hip fracture for  post-operative deep venous thrombosis.  She states she was treated as an  outpatient.   She has been in assisted-living previously for physical therapy and  treatment of a severe osteoarthritis associated with gait abnormalities.   There was entry stating that she has had paroxysmal atrial fibrillation  in the past.   ALLERGIES:  On review of additional notes it is stated that she is  allergic to HYDROCHLOROTHIAZIDE.   Dr. Silvano Rusk progress note of April 15, 2008 was also reviewed.  He  described anasarca to a mild degree.  This was in the context of  markedly reduced serum albumin levels.  He had initiated aldactone in  addition to her Lasix and had stopped her angiotensin-receptor blocker  (Avapro).  These changes were also made to address the pleural  effusions.   Profound anemia of an iron-deficient nature was documented and iron was   initiated.   The [facility] Medical Records were reviewed.  She is on Aldactone 25 mg  daily, 60 mg of Lasix daily, Flagyl 500 mg t.i.d., ferrous sulfate 325  mg b.i.d., Tylenol as needed, milk of magnesia as needed, omeprazole 20  mg daily, tramadol 50 mg twice a day as needed, Senna daily as needed.  Additional medications include Cardizem CD 240 mg daily, Kay Ciel 20 mEq  daily.   REVIEW OF SYSTEMS:  She has had no headaches or nose bleeds.  She denies  any purulent nasal discharge or facial pain.  She denies sore throat.   She has decreased hearing; she denies tinnitus.  The decreased hearing  has been related to increased cerumen impaction for which she has been  on an otic hygiene regimen.   She denies chest pain, palpitations, paroxysmal nocturnal dyspnea.  She  has had a progressive edema of the left lower extremity greater than the  right.   She also denies shortness of breath or pleuritic chest pain.  She has  had sputum production of clear material.   She denies abdominal pain, dysphagia, indigestion, black or tarry stool,  rectal bleeding.  She has had some loose diarrheal stools.   Despite the swelling, she denies any pain in the left lower extremity.   She denies any numbness or tingling.  She has profound weakness and is  essentially bed-bound.   She denies any active wounds or nonhealing lesions.   She denies anxiety or depression.   PHYSICAL EXAMINATION:  GENERAL:  She is bright and animated.  Despite a  history of possible dementia, she is oriented to time, place and person.  She does have decreased hearing and often will reply incorrectly as when  she was asked the date.  Initially, she said 40 thinking I had asked  her date of birth.  HEENT:  She has a dense left cataract or absence of light reflex.  She  denies any cataracts by history.  Arteriolar narrowing is noted on the  right.  Nares are patent with no purulence.  She has complete dentures.  Oral  tissues are somewhat dry.  She has increased cerumen collections in  both ears with decreased hearing as noted.  LYMPHATIC:  She has no lymphadenopathy about the head, neck or axilla.  CHEST:  Chest is clear without rubs or splinting.  HEART:  She has an S4 which is somewhat distant with flow murmur.  ABDOMEN:  There is a dressed operative wound of the lower abdomen.  Abdomen is nontender to palpation.  EXTREMITIES:  There  is generalized muscle wasting.  Dramatic edema of  the left lower extremity in toto is present.  Denna Haggard' sign is negative.  Pedal pulses are decreased but present.  There is 1 to 2+ edema in the  entire left lower extremity.  There are stasis dermatitis changes over  both shins.  She has limited range of motion of the left lower extremity  related to the previous hip fracture.  VITAL SIGNS:  She is afebrile at this time.  Blood pressure initially  was 100/60, pulse 94 and regular, respiratory rate 18.  O2 saturations  were 92% on room air.   Venous Doppler revealed an absence of venous flow in the entire left leg  with an extensive deep venous thrombosis.   She will be admitted and Lovenox continued.  Consideration will be given  to an inferior vena caval umbrella.  This was discussed with her.      Titus Dubin. Alwyn Ren, MD,FACP,FCCP  Electronically Signed     WFH/MEDQ  D:  04/22/2008  T:  04/22/2008  Job:  [id number]   cc:   Barry Dienes. Eloise Harman, M.D.  Fax: [phone]   Stacie Glaze, MD  [address] [place]  [place] [address]

## 2011-05-10 NOTE — Consult Note (Signed)
Megan, Mcclure              ACCOUNT NO.:  [id number]   MEDICAL RECORD NO.:  [id number]          PATIENT TYPE:  INP   LOCATION:  1227                         FACILITY:  [facility]   PHYSICIAN:  Clovis Pu. Cornett, M.D.DATE OF BIRTH:  May 16, 1926   DATE OF CONSULTATION:  03/09/2008  DATE OF DISCHARGE:                                 CONSULTATION   PHYSICIAN REQUESTING CONSULTATION:  Tinnie Gens P. Caporossi, MD   REASON FOR CONSULTATION:  Abdominal pain, dysuria and free fluid,  peritonitis and abdominal pain.   HISTORY OF PRESENT ILLNESS:  The patient is an 77 year old female who  presents to the emergency room from [facility] with a 1-day  history of mental status changes.  She apparently seen on Friday and was  being treated for a urinary tract infection.  She became much more  confused over the weekend and was brought in today to be evaluated. She  is complaining of dysuria and abdominal pain.  She was noted to have a  blood pressure in the 80s when she arrived.  Her urinalysis showed  significant turbidity with too numerous to count bacteria, numerous  white cells and the appearance of stool.  A CT scan was obtained which  showed free fluid large. A  pelvic abscess and a colovesical fistula as  well as signs of free perforation and acute diverticulitis.  Currently,  she has lower abdominal pain to palpation, is awake and alert but very  confused.  She is complaining of lower abdominal pain but given her  confusion it is difficult to gauge this.  No history of nausea or  vomiting.  History of dysuria.  No back pain.   PAST MEDICAL HISTORY:  1. Essential hypertension.  2. Bilateral lower extremity venous stasis disease.  3. Dementia.  4. Peripheral vascular disease.  5. Constipation.  6. Compression fractures of lower back.  7. Osteoporosis.   PAST SURGICAL HISTORY:  Hysterectomy.   FAMILY HISTORY:  Noncontributory.   SOCIAL HISTORY:  Lives in [facility]. Arby Barrette is her  nephew and power of attorney and makes all of her medical decisions at  [phone].   ALLERGIES TO MEDICATIONS:  None.   MEDICATIONS:  1. Cardizem CD 240 daily.  2. Potassium chloride 10 mEq daily.  3. Ultram 50 mg t.i.d.  4. Mobic 7.5 mg t.i.d.  5. Moexipril 7.5 mg daily.  6. Lasix 40 mg daily.  7. Colace.  8. Milk of Magnesia.  9. Darvocet as needed.   REVIEW OF SYSTEMS:  Difficult to obtain due to patient's confusion.  She  is complaining of lower abdominal pain currently.   PHYSICAL EXAMINATION:  Temperature 97, pulse 85, blood pressure 86/50.  GENERAL APPEARANCE:  Confused, white female in mild distress.  HEENT:  Extraocular movements are intact.  No evidence of scleral  icterus.  NECK:  Supple, nontender for range of motion.  No mass.  CHEST:  Clear to auscultation.  Chest wall motion normal.  CARDIOVASCULAR:  Regular rate and rhythm without rub, murmur or gallop.  EXTREMITIES:  Cool and swollen with edema bilaterally 1+.  ABDOMEN:  Diffusely tender especially at lower abdomen. Previous scar  from hysterectomy noted.  No hernia.  No mass.  GU:  In the Foley bag is what appears to be stool.  EXTREMITIES:  Please see above, decreased range of motion noted as well.  NEURO:  She is confused but appropriate. She is moving all four  extremities at this point.   DIAGNOSTIC STUDIES:  White count 29,000 with left shift.  Hemoglobin  9.5, platelet count to 278,000, sodium 131, potassium 5.6, chloride 99,  CO2 27, BUN 36, creatinine 2.8, glucose 81. Abdominal and pelvic CT, I  have reviewed which shows free fluid, a large pelvic abscess,  appears  to be a colovesical fistula.  Significant pelvic inflammation.   IMPRESSION:  1. Acute diverticulitis with perforation, free fluid and concommtitant      colovesical fistula and a large pelvic abscess.  2. Sepsis.  3. Acute renal insufficiency.  4. Essential hypertension.  5. Lower extremity venous stasis  disease.   PLAN:  I have discussed this with her nephew who is her power of  attorney. The patient is too confused, which is probably secondary to  sepsis, to be able to answer me and give consent.  Her nephew, Arby Barrette, gives all informed consent decisions and medical decisions and I  have spoken with him as well as the patient's sister who lives in  [place] about this situation.  I do feel an emergent exploration in  this setting is indicated, especially since she is showing signs of  sepsis.  I explained that there is a high risk of death and  complications with surgery but unfortunately without it I am afraid this  will progress as it already is.  They voiced their understanding and  agreed to  proceed with exploratory laparotomy with probable colostomy and sigmoid  colectomy.  The risk of bleeding, infection, death, prolonged  ventilatory status, ICU stay, multi-organ failure and multisystem organ  failure were all discussed.  They voiced their understanding and agreed  to proceed.      Thomas A. Cornett, M.D.  Electronically Signed     TAC/MEDQ  D:  03/09/2008  T:  03/10/2008  Job:  [id number]   cc:   Stacie Glaze, MD  [address] [place]  [place] [address]

## 2011-05-13 NOTE — Discharge Summary (Signed)
[name], [name]              ACCOUNT NO.:  [id number]   MEDICAL RECORD NO.:  [id number]          PATIENT TYPE:  INP   LOCATION:  1513                         FACILITY:  [facility]   PHYSICIAN:  [name]. Charlann Boxer, M.D.  DATE OF BIRTH:  1926-04-22   DATE OF ADMISSION:  11/13/2006  DATE OF DISCHARGE:  11/17/2006                                 DISCHARGE SUMMARY   PRINCIPAL DIAGNOSIS:  Left intertrochanteric femur fracture.   SECONDARY DIAGNOSES:  1. History of peripheral vascular disease.  2. Hypertension.   PROCEDURES PERFORMED:  On November 13, 2006, the patient underwent an open  reduction internal fixation of the left intertrochanteric femur fracture by  myself.   BRIEF ADMITTING HISTORY:  The patient is a 77 year old female who was in her  normal state of health, self-ambulating and living by herself, when she fell  down a couple of stairs.  She had immediate onset of pain and was brought to  the emergency room where radiographs revealed intertrochanteric femur  fracture.  She was initially seen and evaluated by one of my partners, Dr.  Hayden Rasmussen, who had gotten her admitted and planned to do the surgery that  evening.  I was on call the following day, and was subsequently asked to  take over care.   BRIEF HOSPITAL COURSE:  The patient was admitted on November 13, 2006, by  one of my partners, Dr. Hayden Rasmussen.  She was noted to have a left  intertrochanteric femur fracture.  She was seen by medicine and by  cardiology and cleared for surgery.  There were no cardiac abnormalities  that needed to be tended to.  Internal medicine stayed on board throughout  the hospital stay for perioperative management.   The patient was noted to have sustained an L1 compression fracture, age  undetermined, but perhaps related to this same fall.   On November 13, 2006, the patient underwent open reduction and internal  fixation of the left femur fracture.  Her postoperative course was  unremarkable.  She did not have any cardiac events perioperatively.  On  postop day 1, she was seen by physical therapy and progressed.  Her progress  was fairly slow, then given the fact that she lived alone.  Consultation was  made for case management to help assist with SNF placement.   She was followed by medicine in the hospital on November 16, 2006.  They had  recommended following up her hematocrit, which had dropped down to 23.5,  with a recommendation that if her hemoglobin dropped lower than 8 to be  transfused blood.  On postop day 4, her hematocrit was stable with a value  of 24.  Her hemoglobin was above 8.  Her electrolytes remained normal with a  last sodium of 136, potassium 3.8, BUN and creatinine of 13/0.8.   On November 17, 2006, case management was able to find her a bed at a short-  term nursing facility.  Arrangements were made for discharge.   Please note that physical therapy had seen the patient.  Her progress was  slow.  No restrictions  were set.  At 50% weightbearing, she was having a  difficult time getting around and requiring a lift for transport.   This is the reason for her transfer to nursing facility.   DISCHARGE SUMMARY:  The patient will be discharged on November 17, 2006 to  [facility] for rehab.  She will be partial weight bearing  or 50% on the left lower extremity as tolerable.  Her labs had stabilized at  the time of discharge, including her electrolytes.  Her general medicine and  cardiology issues can be followed on an outpatient basis.  I will plan on  seeing her in the office on a 10-day period of time to evaluate her wound  and make sure she is progressing.  She will stay in rehab until she is able  to become more independent.  She can eat her regular diet.   DISCHARGE MEDICATIONS:  Include her home medications of:  1. Cardizem 250 mg daily.  2. Univasc one-half tablet daily.  3. Potassium 10 mEq daily.  4. Furosemide 20  mEq daily.  5. Multivitamin.  6. She will be also discharged on Lovenox 40 mg subcu to be given daily.      This should be given until November 27, 2006.  At that point, she can be      placed on aspirin 325 mg for one month daily.  7. She will also be discharged on Narco 5/325 mg 1-2 tablets p.o. q.6      hours p.r.n..  8. She will be discharged on iron supplement 325 mg p.o. t.i.d. for 2      weeks.  9. Colace 100 mg p.o. b.i.d.Marland Kitchen  10.EOC and LOC order as needed.  11.She will also have Robaxin 500 mg p.o. q.8 hours p.r.n. pain or muscle      spasm.   DISCHARGE DIAGNOSES:  1. Left intertrochanteric femur fracture.  2. L1 compression fracture.  3. Peripheral vascular insufficiency.  4. Hypertension.      [name] Charlann Boxer, M.D.  Electronically Signed     MDO/MEDQ  D:  11/17/2006  T:  11/17/2006  Job:  [id number]

## 2011-05-13 NOTE — Consult Note (Signed)
[name], [name]              ACCOUNT NO.:  [id number]   MEDICAL RECORD NO.:  [id number]          PATIENT TYPE:  EMS   LOCATION:  ED                           FACILITY:  [facility]   PHYSICIAN:  Audery Amel, MD    DATE OF BIRTH:  03-13-1926   DATE OF CONSULTATION:  DATE OF DISCHARGE:                                   CONSULTATION   PRIMARY CARE PHYSICIAN:  [facility].   REASON FOR CONSULTATION:  Preoperative evaluation of diagnosis of left hip  fracture.   HISTORY OF PRESENT ILLNESS:  The patient is a 77 year old white female with  past medical history notable for hypertension and venous insufficiency who  presents to the [facility] Emergency Department for evaluation  after sustaining a fall earlier this evening.  The patient states she was  walking down some steps when she lost her balance and fell striking her left  hip.  She stated that it was painful at the time; however, currently is  having minimal symptoms.  She was unable to stand after sustaining a fall  and was assisted to a chair by her neighbors.  The patient denies any chest  pain, shortness of breath, dyspnea on exertion, PND, orthopnea or  palpitations.  In addition, she denies any symptoms worrisome for presyncope  or syncope.  She states that she clearly lost her footing and that is what  ultimately led to her fall.  Otherwise, she is without complaints.   REVIEW OF SYSTEMS:  Please see the HPI.  In general, the patient denies any  easy fatigue, fevers, night sweats or chills.  She denies any headaches,  changes in visual or auditory acuity, nasal discharge, sore throat but does  endorse dysphagia occasionally with solid food.  She denies any shortness of  breath, dyspnea on exertion, cough productive of sputum or wheezing.  She  denies any chest pain, PND, orthopnea, palpitations, presyncope or syncope.  GI:  Denies diarrhea, constipation, nausea or vomiting.  No history of  hematemesis, melena or bright red blood per rectum.  GU:  No dysuria,  frequency or urgency.  EXTREMITIES:  Left hip pain as per HPI.  Chronic  lower extremity swelling.  SKIN:  No rashes or ulcerations.  NEUROLOGICAL:  Denies dizziness, weakness and dysarthria.   PAST MEDICAL HISTORY:  1. Hypertension.  2. Chronic venous insufficiency, status post venous stripping.   CURRENT MEDICATIONS:  1. Cardizem CD 240 mg once daily.  2. Lasix 40 mg once daily.  3. Potassium 20 mEq daily.  4. Univasc once daily, however, does is unknown.   SOCIAL HISTORY:  The patient is widowed, currently lives alone.  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  She does have a history of  tobacco abuse with approximately 40-pack-year history; however, quit  approximately 10 or 15 years ago.  She denies any alcohol or illicit  substance.   FAMILY HISTORY:  The patient states that her father suffered from a  myocardial infarction; however, she was very young.  There is no other  history of coronary  artery disease in her family.  There is a general  history of hypertension and diabetes.   ALLERGIES:  No known drug allergies.   PHYSICAL EXAMINATION:  Blood pressure is 138/78, heart rate is 87, O2  saturation is 96% on room air, temperature is 97.2.  In general, the patient  is well-developed, well-nourished, no acute distress and pleasantly  conversant.  Neck is supple, full range of motion, mild JVD.  No palpable  thyromegaly and moderate fullness in the neck consistent with possible  goiter.  Carotid upstrokes are equal and symmetric bilaterally without  audible bruit.  Chest is clear to auscultation bilaterally.  Cardiovascular  examination reveals a normal S1-S2 without audible murmurs or gallops.  Her  PMI is nondisplaced in the left midclavicular line.  Her abdomen is soft,  nontender, and nondistended.  Positive bowel sounds.  No hepatosplenomegaly.  Her abdominal aorta is palpable.  GU  is normal female genitalia with Foley  catheter in place.  Extremities reveal 1+ bilateral lower extremity edema.  Peripheral pulses are 2+ and symmetric bilaterally.  Skin has no rashes,  ulceration or xanthoma.  Neurologic is alert and oriented x3.  Cranial  nerves II through XII grossly intact.  Otherwise nonfocal.   LABORATORY DATA:  CBC:  White count 19.9, hematocrit 31.7, platelet count  391.  Sodium 140, potassium 3.9, chloride 108, CO2 is 24, BUN is 19,  creatinine 0.7 and glucose 136.  AST and ALT are 27 and 20.  T-bili is 0.8,  alkaline phosphate 81, INR 1.1, PTT 28.  Urinalysis:  Specific gravity  1.025, negative nitrite and leukocyte esterase, negative protein, negative  blood.  EKG normal sinus rhythm with no evidence of injury or ischemia per  my interpretation.   IMPRESSION:  1. Status post left intertrochanteric hip fracture.  2. Hypertension.  3. History of venous insufficiency.   PLAN:  Besides Ms. Crownover's advanced age at 77 years old, the patient  possesses only minor clinical predictors for perioperative cardiovascular  event.  This makes her a low risk patient for relatively low risk procedure.  She has no symptoms that would suggest the presence of underlying coronary  artery disease.  She reports an excellent functional status and is able to  perform 4-6 METS with no limitations.  It is our recommendation that she  proceed with surgery to repair her left hip.  We do recommend that her  diltiazem be discontinued in favor of perioperative beta blockade in attempt  to reduce her cardiovascular event rate.  Recommend starting metoprolol 25  mg p.o. q.6h. with a goal heart rate of 60.  Would consider holding her  Univasc preoperatively.  This may lead to hypotension.  Recommend that she  continue her current potassium supplement and daily Lasix.  Would recommend  starting a baby aspirin at 81 mg once daily postoperatively for primary prophylaxis.  We will follow  the patient along with you through her hospital  course and determine any further need for postoperative risk stratification.  We appreciate the opportunity to participate in the care of your patient and  please feel free to contact us with any questions.      Audery Amel, MD  Electronically Signed     SHG/MEDQ  D:  11/13/2006  T:  11/14/2006  Job:  [id number]

## 2011-05-13 NOTE — Assessment & Plan Note (Signed)
[facility]                                 ON-CALL NOTE   NAME:[name], [name]                     MRN:          [id number]  DATE:01/27/2007                            DOB:          [birth year]    TELEPHONE CONSULTATION:   TIME OF INTERACTION:  Was 11:10 in the morning.   PHONE NUMBER:  Is [phone].   The caller was Albertine Grates.   OBJECTIVE:  The patient has a hip fracture and is in severe pain, was  offered an appointment at 12:25.   PRIMARY CARE [place] [place]:  Unknown.     Arta Silence, MD  Electronically Signed    RNS/MedQ  DD: 01/27/2007  DT: 01/27/2007  Job #: [id number]

## 2011-05-13 NOTE — Op Note (Signed)
[name], [name]              ACCOUNT NO.:  [id number]   MEDICAL RECORD NO.:  [id number]          PATIENT TYPE:  INP   LOCATION:  1513                         FACILITY:  [facility]   PHYSICIAN:  [name]. Charlann Boxer, M.D.  DATE OF BIRTH:  30-Aug-1926   DATE OF PROCEDURE:  11/13/2006  DATE OF DISCHARGE:                                 OPERATIVE REPORT   PREOPERATIVE DIAGNOSIS:  Left displaced unstable intertrochanteric femur  fracture.   POSTOPERATIVE DIAGNOSIS:  Left displaced unstable intertrochanteric femur  fracture.   PROCEDURE:  Open reduction and internal fixation of left hip using the  intramedullary device, specifically DePuy 11 x 200-mm, 125-degree  intramedullary hip screw with a 105-mm lag screw and a 36-mm distal  interlock screw.   SURGEON:  [name]. Charlann Boxer, M.D.   ASSISTANT:  Yetta Glassman. Mann, PA.   ANESTHESIA:  General.   BLOOD LOSS:  50 mL.   COMPLICATIONS:  None.   INDICATIONS FOR PROCEDURE:  Ms. Koelzer is a 77 year old female who was in  her normal state of health when she fell outside of her house last evening,  when she was cooking her meal.  She was seen and evaluated at her house and  brought to the emergency room for evaluation of hip fracture.  Radiographs  revealed above.  She was initially seen and evaluated by one of my partners,  Dr. Hayden Rasmussen, who then asked for transfer in care.  I reviewed the  radiographs and the patient's chart.  The patient was seen and evaluated by  the medical service.  I reviewed with the patient the risks and benefits of  surgery, and consent obtained for open reduction and internal fixation.   PROCEDURE IN DETAIL:  The patient was brought to the operative theater.  Once adequate anesthesia and preoperative antibiotics, 1 gram of Ancef, were  administered, the patient was positioned supine with the right leg flexed  and adducted out of the way, with bony prominences padded, particularly over  the peroneal nerve.  The  left foot was then placed in the traction shoe with  bony prominences padded.   Traction and internal rotation was applied.  The fracture was reduced under  fluoroscopic imaging.  The patient was noted to have a comminuted proximal  femur fracture with fracture of the greater trochanter, fracture of the  intertrochanteric segment of the lesser trochanter displaced.  There was  also noted to be extension of the fracture, meaning that the femoral neck  was anterior to the shaft.  This was all recognized during the initial  preparation.  Following this, the hip was prepped and draped in a sterile  fashion.  Under fluoroscopic imaging, the tip of the trochanter was  identified and a lateral incision was made proximal to this.  Sharp  dissection was carried down and then the tip of the trochanter was entered  with a guidewire.  Guidewire was then passed into the femur and over-reamed.  The 11 x 200-mm nail was then passed by hand.  With the nail inside the  femur, attention was then directed to placement  of the lag screw.   Noting the deformity of the femoral neck, with its anterior displacement, I  did make a separate incision to allow for placement of Cobb elevator on the  anterior neck of the femur.   Under lateral radiographs, I was able to reduce the anterior aspect of the  femoral neck back to an anatomic position, and with this held in place, I  passed a guidewire into the center of the head.  It was a slightly bit  posterior, but had good bony purchase and was in the center of the head on  the AP view.   I then drilled, tapped and placed a 105-mm lag screw and then applied some  compression, further reducing the fracture on the AP view.   With this taken care of and performed, attention was directed to the distal  interlock, which was drilled and 36-mm screw placed.  All instrumentation  was removed.  Final radiographs were obtained in the AP and lateral planes.  The wounds were  all irrigated with normal saline solution.  The proximal  gluteal fascia was reapproximated with a #1 Vicryl.  The remainder of the  wound was closed in layers with 2-0 Vicryl and staples on the skin.  The  thigh was cleaned, dried and dressed sterilely with Adaptic, dressing  sponges and tape.   The patient was then brought to the recovery room, extubated and in stable  condition, without complication.      [name] Charlann Boxer, M.D.  Electronically Signed     MDO/MEDQ  D:  11/14/2006  T:  11/15/2006  Job:  [id number]

## 2011-05-13 NOTE — H&P (Signed)
[name], [name]              ACCOUNT NO.:  [id number]   MEDICAL RECORD NO.:  [id number]          PATIENT TYPE:  EMS   LOCATION:  ED                           FACILITY:  [facility]   PHYSICIAN:  Erasmo Leventhal, M.D.DATE OF BIRTH:  29-Mar-1926   DATE OF ADMISSION:  11/13/2006  DATE OF DISCHARGE:                                HISTORY & PHYSICAL   CHIEF COMPLAINT:  Left hip pain.   HISTORY OF PRESENT ILLNESS:  This is a 77 year old lady with a history of  fall this evening with pain in her left hip and inability to ambulate.  She  was brought by ambulance to the emergency room where x-rays were obtained,  and an intertrochanteric fracture of her left hip was noted.  She is  subsequently to be admitted to the hospital at this time.  We will obtain  medical clearance from [facility] hospitalists, as her medical doctor is Dr.  Darryll Capers, and I will plan on ORIF of her fracture tomorrow if medically  cleared.  The surgery risks, benefits, and aftercare were discussed in  detail with the patient and her nephew and surgery to go ahead as scheduled.   ALLERGIES:  NONE.   CURRENT MEDICATIONS:  1. Cardizem ER 240 mg one daily.  2. Potassium chloride 10 mEq 2 pills daily.  3. Univasc, though she does not know the dose.   PAST MEDICAL HISTORY:  1. Peripheral vascular insufficiency.  2. Hypertension.   PAST SURGICAL HISTORY:  Vein stripping.   FAMILY HISTORY:  Positive for coronary disease, hypertension, and diabetes.   SOCIAL HISTORY:  The patient is single.  She lives alone at home.  She does  not smoke though she used to, and she does not drink.   REVIEW OF SYSTEMS:  NEUROLOGIC:  Negative for blurry vision or dizziness.  PULMONARY:  Negative for shortness of breath, PND, or orthopnea.  CARDIOVASCULAR:  Positive for hypertension and vascular insufficiency.  Negative for angina, chest pain.  GI:  Negative for ulcers, hepatitis.  GU:  Negative for urinary tract difficulty.   EXTREMITIES:  Positives as in HPI  plus osteoarthritis of both knees that bothers her from time to time.   PHYSICAL EXAMINATION:  VITAL SIGNS:  Blood pressure 138/78, pulse 86 and  regular, respirations 20.  GENERAL:  This is a well-developed, well-nourished lady in mild distress  with her left hip.  HEENT:  Head normocephalic, atraumatic.  Nose patent.  Ears patent.  Ears  with excess cerumen in the canals bilaterally.  Pupils equal round and  reactive to light.  NECK:  Supple without adenopathy.  Carotids 2+ without bruit.  CHEST:  Clear to auscultation.  No rales or rhonchi.  Respirations 20.  HEART:  Regular rate and rhythm at 86 beats per minute without murmur.  ABDOMEN:  Soft with active bowel sounds.  No masses, organomegaly.  __________ lateral pelvic compression.  NEUROLOGIC:  Patient alert and  oriented to time, place, and person.  Cranial nerves II-XII grossly intact.  EXTREMITIES:  Show within normal limits the upper extremities and right leg.  Left leg  is shortened and rotated, painful at the left hip.  Neurovascular  status is intact.  She is bilateral lower extremity swelling secondary to  her peripheral vascular insufficiency and wears compression stockings which  are in place.   X-ray shows a displaced intertrochanteric fracture of the left hip.   IMPRESSION:  Intertrochanteric fracture of the left hip.   PLAN:  Admit, obtain medical clearance from [facility] hospitalist, and  scheduled for ORIF of her fracture tomorrow.      Jaquelyn Bitter. Chabon, P.A.    ______________________________  Erasmo Leventhal, M.D.    SJC/MEDQ  D:  11/13/2006  T:  11/14/2006  Job:  [id number]

## 2011-09-19 LAB — DIFFERENTIAL
Basophils Absolute: 0.1
Basophils Absolute: 0
Basophils Absolute: 0
Basophils Relative: 1
Basophils Relative: 0
Basophils Relative: 0
Eosinophils Absolute: 0
Eosinophils Absolute: 0.5
Eosinophils Relative: 0
Eosinophils Relative: 0
Eosinophils Relative: 0
Lymphocytes Relative: 5 — ABNORMAL LOW
Lymphs Abs: 1.2
Lymphs Abs: 0.8
Lymphs Abs: 0.9
Monocytes Absolute: 0.2
Monocytes Absolute: 0.4
Monocytes Relative: 1 — ABNORMAL LOW
Monocytes Relative: 4
Neutro Abs: 15.4 — ABNORMAL HIGH
Neutro Abs: 39.5 — ABNORMAL HIGH
Neutrophils Relative %: 88 — ABNORMAL HIGH
Neutrophils Relative %: 88 — ABNORMAL HIGH

## 2011-09-19 LAB — COMPREHENSIVE METABOLIC PANEL
ALT: 10
ALT: 9
ALT: 9
ALT: 9
AST: 11
AST: 12
AST: 13
AST: 14
Albumin: 1 — ABNORMAL LOW
Albumin: 1 — ABNORMAL LOW
Albumin: 1 — ABNORMAL LOW
Alkaline Phosphatase: 103
BUN: 18
BUN: 19
BUN: 32 — ABNORMAL HIGH
CO2: 23
CO2: 24
CO2: 29
CO2: 31
Calcium: 7.9 — ABNORMAL LOW
Calcium: 8 — ABNORMAL LOW
Calcium: 8.3 — ABNORMAL LOW
Chloride: 108
Chloride: 110
Chloride: 116 — ABNORMAL HIGH
Creatinine, Ser: 1.3 — ABNORMAL HIGH
Creatinine, Ser: 2.74 — ABNORMAL HIGH
Creatinine, Ser: 2.79 — ABNORMAL HIGH
GFR calc Af Amer: 17 — ABNORMAL LOW
GFR calc Af Amer: 20 — ABNORMAL LOW
GFR calc Af Amer: 26 — ABNORMAL LOW
GFR calc Af Amer: 48 — ABNORMAL LOW
GFR calc non Af Amer: 14 — ABNORMAL LOW
GFR calc non Af Amer: 16 — ABNORMAL LOW
GFR calc non Af Amer: 22 — ABNORMAL LOW
GFR calc non Af Amer: 40 — ABNORMAL LOW
Glucose, Bld: 101 — ABNORMAL HIGH
Glucose, Bld: 109 — ABNORMAL HIGH
Potassium: 3.7
Potassium: 4.2
Sodium: 136
Sodium: 136
Sodium: 144
Sodium: 145
Total Bilirubin: 0.5
Total Bilirubin: 0.7
Total Bilirubin: 0.7
Total Protein: 3.5 — ABNORMAL LOW
Total Protein: 3.7 — ABNORMAL LOW

## 2011-09-19 LAB — URINALYSIS, ROUTINE W REFLEX MICROSCOPIC
Bilirubin Urine: NEGATIVE
Bilirubin Urine: NEGATIVE
Glucose, UA: NEGATIVE
Glucose, UA: NEGATIVE
Nitrite: NEGATIVE
Protein, ur: 100 — AB
Protein, ur: 100 — AB
Specific Gravity, Urine: 1.016
Specific Gravity, Urine: 1.017
Urobilinogen, UA: 1
Urobilinogen, UA: 0.2
pH: 6.5

## 2011-09-19 LAB — BASIC METABOLIC PANEL
BUN: 11
BUN: 15
BUN: 27 — ABNORMAL HIGH
BUN: 30 — ABNORMAL HIGH
BUN: 36 — ABNORMAL HIGH
CO2: 20
CO2: 28
CO2: 30
Calcium: 7.8 — ABNORMAL LOW
Calcium: 8.8
Chloride: 100
Chloride: 106
Chloride: 108
Chloride: 109
Chloride: 99
Creatinine, Ser: 0.66
Creatinine, Ser: 2.42 — ABNORMAL HIGH
Creatinine, Ser: 2.65 — ABNORMAL HIGH
Creatinine, Ser: 2.86 — ABNORMAL HIGH
Creatinine, Ser: 3 — ABNORMAL HIGH
GFR calc Af Amer: 19 — ABNORMAL LOW
GFR calc Af Amer: 21 — ABNORMAL LOW
GFR calc Af Amer: 37 — ABNORMAL LOW
GFR calc non Af Amer: 15 — ABNORMAL LOW
GFR calc non Af Amer: 16 — ABNORMAL LOW
Glucose, Bld: 109 — ABNORMAL HIGH
Glucose, Bld: 118 — ABNORMAL HIGH
Glucose, Bld: 136 — ABNORMAL HIGH
Glucose, Bld: 96
Potassium: 4
Potassium: 4.5
Potassium: 4.6
Sodium: 131 — ABNORMAL LOW
Sodium: 143
Sodium: 144

## 2011-09-19 LAB — URINE CULTURE: Colony Count: >=100000

## 2011-09-19 LAB — CBC
HCT: 34 — ABNORMAL LOW
HCT: 25.2 — ABNORMAL LOW
HCT: 28.3 — ABNORMAL LOW
HCT: 30.1 — ABNORMAL LOW
HCT: 31.3 — ABNORMAL LOW
HCT: 33 — ABNORMAL LOW
Hemoglobin: 10.3 — ABNORMAL LOW
Hemoglobin: 10.8 — ABNORMAL LOW
Hemoglobin: 9.5 — ABNORMAL LOW
MCHC: 33.8
MCHC: 34.2
MCHC: 34.4
MCHC: 34.4
MCV: 82
MCV: 81.9
MCV: 82.1
MCV: 82.3
MCV: 82.5
MCV: 83.6
Platelets: 711 — ABNORMAL HIGH
Platelets: 130 — ABNORMAL LOW
Platelets: 191
Platelets: 274
Platelets: 278
Platelets: 329
Platelets: 405 — ABNORMAL HIGH
RBC: 3.37 — ABNORMAL LOW
RBC: 3.37 — ABNORMAL LOW
RBC: 3.65 — ABNORMAL LOW
RBC: 3.69 — ABNORMAL LOW
RBC: 3.75 — ABNORMAL LOW
RBC: 3.77 — ABNORMAL LOW
RDW: 16.8 — ABNORMAL HIGH
RDW: 16.4 — ABNORMAL HIGH
RDW: 17.1 — ABNORMAL HIGH
RDW: 17.3 — ABNORMAL HIGH
WBC: 15.5 — ABNORMAL HIGH
WBC: 17.1 — ABNORMAL HIGH
WBC: 17.3 — ABNORMAL HIGH
WBC: 17.5 — ABNORMAL HIGH
WBC: 18.2 — ABNORMAL HIGH
WBC: 19.5 — ABNORMAL HIGH
WBC: 21.5 — ABNORMAL HIGH
WBC: 29.1 — ABNORMAL HIGH
WBC: 40.7 — ABNORMAL HIGH

## 2011-09-19 LAB — CLOSTRIDIUM DIFFICILE EIA: C difficile Toxins A+B, EIA: NEGATIVE

## 2011-09-19 LAB — BLOOD GAS, ARTERIAL
Bicarbonate: 18.1 — ABNORMAL LOW
Drawn by: 23532
FIO2: 0.21
O2 Content: 2
O2 Saturation: 94.9
O2 Saturation: 96.3
Patient temperature: 97.5
Patient temperature: 97.7
Patient temperature: 98.6
TCO2: 13.2
TCO2: 17.2
pCO2 arterial: 28.2 — ABNORMAL LOW
pH, Arterial: 7.313 — ABNORMAL LOW
pO2, Arterial: 111 — ABNORMAL HIGH

## 2011-09-19 LAB — LIPASE, BLOOD
Lipase: 14
Lipase: 19

## 2011-09-19 LAB — PREALBUMIN: Prealbumin: 7.7 — ABNORMAL LOW

## 2011-09-19 LAB — CARBOXYHEMOGLOBIN
Carboxyhemoglobin: 1.3
Carboxyhemoglobin: 1.6 — ABNORMAL HIGH
Methemoglobin: 1.2
Methemoglobin: 1.3
Methemoglobin: 1.5
O2 Saturation: 61.4
O2 Saturation: 72.1
Total hemoglobin: 10.9 — ABNORMAL LOW
Total hemoglobin: 11 — ABNORMAL LOW

## 2011-09-19 LAB — APTT: aPTT: 37

## 2011-09-19 LAB — MAGNESIUM
Magnesium: 1.5
Magnesium: 1.7

## 2011-09-19 LAB — HEPATIC FUNCTION PANEL
ALT: 9
AST: 15
Albumin: 1 — ABNORMAL LOW
Bilirubin, Direct: 0.2
Total Bilirubin: 0.5

## 2011-09-19 LAB — LACTIC ACID, PLASMA: Lactic Acid, Venous: 1

## 2011-09-19 LAB — URINE MICROSCOPIC-ADD ON

## 2011-09-19 LAB — PROTIME-INR: INR: 1.5

## 2011-09-19 LAB — CROSSMATCH: ABO/RH(D): A POS

## 2011-09-19 LAB — CULTURE, BLOOD (ROUTINE X 2)

## 2011-09-19 LAB — CHOLESTEROL, TOTAL
Cholesterol: 38
Cholesterol: 44
Cholesterol: 61

## 2011-09-19 LAB — TRIGLYCERIDES
Triglycerides: 50
Triglycerides: 58
Triglycerides: 82

## 2011-09-19 LAB — B-NATRIURETIC PEPTIDE (CONVERTED LAB): Pro B Natriuretic peptide (BNP): 31.6

## 2011-09-19 LAB — AMYLASE: Amylase: 21 — ABNORMAL LOW

## 2011-09-20 LAB — BASIC METABOLIC PANEL
BUN: 12
BUN: 4 — ABNORMAL LOW
BUN: 2 — ABNORMAL LOW
BUN: 2 — ABNORMAL LOW
CO2: 22
CO2: 22
CO2: 24
CO2: 25
CO2: 26
CO2: 29
CO2: 26
CO2: 26
CO2: 27
Calcium: 7.8 — ABNORMAL LOW
Calcium: 8 — ABNORMAL LOW
Calcium: 8.2 — ABNORMAL LOW
Calcium: 8.3 — ABNORMAL LOW
Calcium: 8.4
Calcium: 8.6
Calcium: 8.8
Chloride: 107
Chloride: 107
Chloride: 107
Chloride: 112
Chloride: 115 — ABNORMAL HIGH
Chloride: 108
Chloride: 109
Creatinine, Ser: 0.62
Creatinine, Ser: 0.62
Creatinine, Ser: 0.93
Creatinine, Ser: 0.48
Creatinine, Ser: 0.49
Creatinine, Ser: 0.49
GFR calc Af Amer: >60
GFR calc Af Amer: >60
GFR calc Af Amer: >60
GFR calc Af Amer: >60
GFR calc Af Amer: >60
GFR calc Af Amer: >60
GFR calc Af Amer: >60
GFR calc Af Amer: >60
GFR calc non Af Amer: >60
GFR calc non Af Amer: >60
GFR calc non Af Amer: >60
GFR calc non Af Amer: >60
GFR calc non Af Amer: >60
Glucose, Bld: 105 — ABNORMAL HIGH
Glucose, Bld: 80
Glucose, Bld: 81
Glucose, Bld: 85
Glucose, Bld: 89
Glucose, Bld: 95
Glucose, Bld: 78
Potassium: 3.2 — ABNORMAL LOW
Potassium: 3.2 — ABNORMAL LOW
Potassium: 3.4 — ABNORMAL LOW
Potassium: 3.8
Potassium: 3.2 — ABNORMAL LOW
Sodium: 136
Sodium: 138
Sodium: 139
Sodium: 140
Sodium: 140
Sodium: 141
Sodium: 141
Sodium: 135

## 2011-09-20 LAB — CBC
HCT: 26.6 — ABNORMAL LOW
HCT: 27.6 — ABNORMAL LOW
HCT: 27.8 — ABNORMAL LOW
HCT: 31 — ABNORMAL LOW
HCT: 27.1 — ABNORMAL LOW
HCT: 28.8 — ABNORMAL LOW
Hemoglobin: 10.8 — ABNORMAL LOW
Hemoglobin: 8.4 — ABNORMAL LOW
Hemoglobin: 8.7 — ABNORMAL LOW
Hemoglobin: 8.7 — ABNORMAL LOW
Hemoglobin: 9 — ABNORMAL LOW
Hemoglobin: 9.1 — ABNORMAL LOW
Hemoglobin: 9.5 — ABNORMAL LOW
Hemoglobin: 9.7 — ABNORMAL LOW
Hemoglobin: 9.3 — ABNORMAL LOW
MCHC: 32.8
MCHC: 33.5
MCHC: 33.5
MCHC: 34.9
MCHC: 32.4
MCHC: 32.7
MCV: 81.4
MCV: 82.4
MCV: 82.7
MCV: 82.9
MCV: 82.9
Platelets: 458 — ABNORMAL HIGH
Platelets: 459 — ABNORMAL HIGH
Platelets: 504 — ABNORMAL HIGH
Platelets: 335
RBC: 3.02 — ABNORMAL LOW
RBC: 3.14 — ABNORMAL LOW
RBC: 3.35 — ABNORMAL LOW
RBC: 3.42 — ABNORMAL LOW
RBC: 3.27 — ABNORMAL LOW
RBC: 3.46 — ABNORMAL LOW
RDW: 15.1
RDW: 15.3
RDW: 15.4
RDW: 15.6 — ABNORMAL HIGH
RDW: 15.9 — ABNORMAL HIGH
RDW: 16.1 — ABNORMAL HIGH
RDW: 19.5 — ABNORMAL HIGH
WBC: 6.4
WBC: 8.6
WBC: 9.9

## 2011-09-20 LAB — DIFFERENTIAL
Basophils Absolute: 0
Basophils Absolute: 0
Basophils Relative: 0
Basophils Relative: 0
Eosinophils Absolute: 0
Eosinophils Absolute: 0.3
Eosinophils Relative: 0
Lymphocytes Relative: 5 — ABNORMAL LOW
Lymphocytes Relative: 7 — ABNORMAL LOW
Lymphs Abs: 0.8
Monocytes Absolute: 0.4
Neutro Abs: 14.2 — ABNORMAL HIGH

## 2011-09-20 LAB — FECAL LACTOFERRIN, QUANT: Fecal Lactoferrin: POSITIVE

## 2011-09-20 LAB — URINE MICROSCOPIC-ADD ON

## 2011-09-20 LAB — FERRITIN: Ferritin: 777 — ABNORMAL HIGH (ref 10–291)

## 2011-09-20 LAB — CLOSTRIDIUM DIFFICILE EIA: C difficile Toxins A+B, EIA: NEGATIVE

## 2011-09-20 LAB — SEDIMENTATION RATE: Sed Rate: 24 — ABNORMAL HIGH

## 2011-09-20 LAB — RETICULOCYTES
RBC.: 3.27 — ABNORMAL LOW
Retic Count, Absolute: 55.6

## 2011-09-20 LAB — BODY FLUID CELL COUNT WITH DIFFERENTIAL
Eos, Fluid: 11
Eos, Fluid: 9
Monocyte-Macrophage-Serous Fluid: 30 — ABNORMAL LOW
Monocyte-Macrophage-Serous Fluid: 17 — ABNORMAL LOW

## 2011-09-20 LAB — LACTATE DEHYDROGENASE: LDH: 140

## 2011-09-20 LAB — CULTURE, BLOOD (ROUTINE X 2): Culture: NO GROWTH

## 2011-09-20 LAB — LACTATE DEHYDROGENASE, PLEURAL OR PERITONEAL FLUID
LD, Fluid: 172 — ABNORMAL HIGH
LD, Fluid: 108 — ABNORMAL HIGH

## 2011-09-20 LAB — TSH: TSH: 2.272

## 2011-09-20 LAB — ALBUMIN, FLUID (OTHER): Albumin, Fluid: <1

## 2011-09-20 LAB — LACTIC ACID, PLASMA: Lactic Acid, Venous: 1.2

## 2011-09-20 LAB — FOLATE: Folate: 4.4

## 2011-09-20 LAB — URINE CULTURE

## 2011-09-20 LAB — URINALYSIS, ROUTINE W REFLEX MICROSCOPIC
Bilirubin Urine: NEGATIVE
Ketones, ur: NEGATIVE
Nitrite: NEGATIVE
Protein, ur: 30 — AB
pH: 7.5

## 2011-09-20 LAB — GLUCOSE, SEROUS FLUID: Glucose, Fluid: 89

## 2011-09-20 LAB — PROTEIN, BODY FLUID: Total protein, fluid: <3

## 2011-09-20 LAB — B-NATRIURETIC PEPTIDE (CONVERTED LAB): Pro B Natriuretic peptide (BNP): <30

## 2011-09-20 LAB — BODY FLUID CULTURE: Gram Stain: NONE SEEN

## 2011-09-20 LAB — PATHOLOGIST SMEAR REVIEW

## 2013-04-04 ENCOUNTER — Emergency Department (HOSPITAL_COMMUNITY)
Admission: EM | Admit: 2013-04-04 | Discharge: 2013-04-04 | Disposition: A | Discharge status: Skilled Nursing Facility | Payer: Medicare Other | Attending: Emergency Medicine | Admitting: Emergency Medicine

## 2013-04-04 ENCOUNTER — Encounter (HOSPITAL_COMMUNITY): Payer: Self-pay | Admitting: Emergency Medicine

## 2013-04-04 DIAGNOSIS — Z8719 Personal history of other diseases of the digestive system: Secondary | ICD-10-CM | POA: Insufficient documentation

## 2013-04-04 DIAGNOSIS — I10 Essential (primary) hypertension: Secondary | ICD-10-CM | POA: Insufficient documentation

## 2013-04-04 DIAGNOSIS — Z8701 Personal history of pneumonia (recurrent): Secondary | ICD-10-CM | POA: Insufficient documentation

## 2013-04-04 DIAGNOSIS — X58XXXA Exposure to other specified factors, initial encounter: Secondary | ICD-10-CM | POA: Insufficient documentation

## 2013-04-04 DIAGNOSIS — F0390 Unspecified dementia without behavioral disturbance: Secondary | ICD-10-CM | POA: Insufficient documentation

## 2013-04-04 DIAGNOSIS — Z1639 Resistance to other specified antimicrobial drug: Secondary | ICD-10-CM | POA: Insufficient documentation

## 2013-04-04 DIAGNOSIS — I872 Venous insufficiency (chronic) (peripheral): Secondary | ICD-10-CM | POA: Insufficient documentation

## 2013-04-04 DIAGNOSIS — R238 Other skin changes: Secondary | ICD-10-CM

## 2013-04-04 DIAGNOSIS — Z79899 Other long term (current) drug therapy: Secondary | ICD-10-CM | POA: Insufficient documentation

## 2013-04-04 DIAGNOSIS — Y939 Activity, unspecified: Secondary | ICD-10-CM | POA: Insufficient documentation

## 2013-04-04 DIAGNOSIS — Z8619 Personal history of other infectious and parasitic diseases: Secondary | ICD-10-CM | POA: Insufficient documentation

## 2013-04-04 DIAGNOSIS — Z8739 Personal history of other diseases of the musculoskeletal system and connective tissue: Secondary | ICD-10-CM | POA: Insufficient documentation

## 2013-04-04 DIAGNOSIS — Z862 Personal history of diseases of the blood and blood-forming organs and certain disorders involving the immune mechanism: Secondary | ICD-10-CM | POA: Insufficient documentation

## 2013-04-04 DIAGNOSIS — Z86718 Personal history of other venous thrombosis and embolism: Secondary | ICD-10-CM | POA: Insufficient documentation

## 2013-04-04 DIAGNOSIS — M79609 Pain in unspecified limb: Secondary | ICD-10-CM

## 2013-04-04 DIAGNOSIS — Z87440 Personal history of urinary (tract) infections: Secondary | ICD-10-CM | POA: Insufficient documentation

## 2013-04-04 DIAGNOSIS — K219 Gastro-esophageal reflux disease without esophagitis: Secondary | ICD-10-CM | POA: Insufficient documentation

## 2013-04-04 DIAGNOSIS — Z8679 Personal history of other diseases of the circulatory system: Secondary | ICD-10-CM | POA: Insufficient documentation

## 2013-04-04 DIAGNOSIS — IMO0002 Reserved for concepts with insufficient information to code with codable children: Secondary | ICD-10-CM | POA: Insufficient documentation

## 2013-04-04 DIAGNOSIS — Y929 Unspecified place or not applicable: Secondary | ICD-10-CM | POA: Insufficient documentation

## 2013-04-04 DIAGNOSIS — H6121 Impacted cerumen, right ear: Secondary | ICD-10-CM

## 2013-04-04 DIAGNOSIS — H9190 Unspecified hearing loss, unspecified ear: Secondary | ICD-10-CM | POA: Insufficient documentation

## 2013-04-04 DIAGNOSIS — M7989 Other specified soft tissue disorders: Secondary | ICD-10-CM

## 2013-04-04 DIAGNOSIS — H6120 Impacted cerumen, unspecified ear: Secondary | ICD-10-CM | POA: Insufficient documentation

## 2013-04-04 HISTORY — DX: Urinary tract infection, site not specified: N39.0

## 2013-04-04 HISTORY — DX: Enterocolitis due to Clostridium difficile, not specified as recurrent: A04.72

## 2013-04-04 HISTORY — DX: Anuria and oliguria: R34

## 2013-04-04 HISTORY — DX: Age-related osteoporosis without current pathological fracture: M81.0

## 2013-04-04 HISTORY — DX: Acute embolism and thrombosis of unspecified deep veins of unspecified distal lower extremity: I82.4Z9

## 2013-04-04 HISTORY — DX: Crohn's disease, unspecified, without complications: K50.90

## 2013-04-04 HISTORY — DX: Diarrhea, unspecified: R19.7

## 2013-04-04 HISTORY — DX: Essential (primary) hypertension: I10

## 2013-04-04 HISTORY — DX: Venous insufficiency (chronic) (peripheral): I87.2

## 2013-04-04 HISTORY — DX: Unspecified osteoarthritis, unspecified site: M19.90

## 2013-04-04 HISTORY — DX: Unspecified dementia, unspecified severity, without behavioral disturbance, psychotic disturbance, mood disturbance, and anxiety: F03.90

## 2013-04-04 HISTORY — DX: Anemia, unspecified: D64.9

## 2013-04-04 HISTORY — DX: Constipation, unspecified: K59.00

## 2013-04-04 HISTORY — DX: Pneumonia, unspecified organism: J18.9

## 2013-04-04 HISTORY — DX: Gastro-esophageal reflux disease without esophagitis: K21.9

## 2013-04-04 HISTORY — DX: Sepsis, unspecified organism: A41.9

## 2013-04-04 HISTORY — DX: Allergic rhinitis, unspecified: J30.9

## 2013-04-04 HISTORY — DX: Vesicointestinal fistula: N32.1

## 2013-04-04 HISTORY — DX: Peripheral vascular disease, unspecified: I73.9

## 2013-04-04 HISTORY — DX: Resistance to other specified antimicrobial drug: Z16.39

## 2013-04-04 LAB — BASIC METABOLIC PANEL
BUN: 17 mg/dL (ref 6–23)
CO2: 26 mEq/L (ref 19–32)
Glucose, Bld: 90 mg/dL (ref 70–99)
Potassium: 4.3 mEq/L (ref 3.5–5.1)
Sodium: 141 mEq/L (ref 135–145)

## 2013-04-04 LAB — CBC WITH DIFFERENTIAL/PLATELET
Hemoglobin: 13.6 g/dL (ref 12.0–15.0)
Lymphocytes Relative: 19 % (ref 12–46)
Lymphs Abs: 1.6 10*3/uL (ref 0.7–4.0)
MCH: 28.9 pg (ref 26.0–34.0)
MCV: 89.6 fL (ref 78.0–100.0)
Monocytes Relative: 6 % (ref 3–12)
Neutrophils Relative %: 74 % (ref 43–77)
Platelets: 225 10*3/uL (ref 150–400)
RBC: 4.7 MIL/uL (ref 3.87–5.11)
WBC: 8.4 10*3/uL (ref 4.0–10.5)

## 2013-04-04 LAB — PROTIME-INR: INR: 0.95 (ref 0.00–1.49)

## 2013-04-04 MED ORDER — FUROSEMIDE 10 MG/ML IJ SOLN
40.0000 mg | Freq: Once | INTRAMUSCULAR | Status: AC
  Administered 2013-04-04: 40 mg via INTRAVENOUS
  Filled 2013-04-04: qty 4

## 2013-04-04 NOTE — ED Notes (Signed)
ZOX:WR60<AV> Expected date:<BR> Expected time:<BR> Means of arrival:Ambulance<BR> Comments:<BR> 79yo/circulation problem

## 2013-04-04 NOTE — ED Notes (Addendum)
Per EMS: Pt is from [facility]. Facility is concerned about lower extremity circulation due to bilateral lower leg swelling, discoloration, and being cool to touch. EMS reports bilateral extremities are blue/red in appearance. Pt has a history of DVT and venous insufficiency. EMS reports being unable to locate pulses, however reports positive capillary refill. Patient denies pain.

## 2013-04-04 NOTE — ED Provider Notes (Signed)
History     CSN: [id number]  Arrival date & time 04/04/13  1622   First MD Initiated Contact with Patient 04/04/13 1636      Chief Complaint  Patient presents with  . Venous Insufficiency    (Consider location/radiation/quality/duration/timing/severity/associated sxs/prior treatment) HPI Comments: Patient presents to the ER for evaluation of a blister on the back of her right lower leg. She was sent to the ER because of lower leg swelling with discoloration to the lower extremities. Patient has cyanosis of the toes bilaterally with left being worse. Patient reports that she thinks it always is like that. She denies numbness, tingling and pain in the extremities. There has not been any chest pain or shortness of breath.   Past Medical History  Diagnosis Date  . Osteoarthrosis, unspecified whether generalized or localized, unspecified site   . Hypertension   . Crohn's disease   . Anemia, unspecified   . Senile dementia, uncomplicated   . Sepsis(995.91)   . Pneumonia, organism unspecified   . Intestinal infection due to Clostridium difficile   . Urinary tract infection, site not specified   . Intestinovesical fistula   . Unspecified venous (peripheral) insufficiency   . Peripheral vascular disease, unspecified   . Acute venous embolism and thrombosis of deep vessels of distal lower extremity   . Esophageal reflux   . Unspecified constipation   . Infection with microorganisms resistant to other specified drugs without mention of resistance to multiple drugs   . Oliguria and anuria   . Diarrhea   . Osteoporosis, unspecified   . Allergic rhinitis, cause unspecified     Past Surgical History  Procedure Laterality Date  . Eye surgery      Cataract removal    No family history on file.  History  Substance Use Topics  . Smoking status: Not on file  . Smokeless tobacco: Not on file  . Alcohol Use: Not on file    OB History   Grav Para Term Preterm Abortions TAB SAB Ect  Mult Living                  Review of Systems  HENT: Positive for hearing loss.   Respiratory: Negative for shortness of breath.   Cardiovascular: Positive for leg swelling. Negative for chest pain.  Skin: Positive for color change.  All other systems reviewed and are negative.    Allergies  Hydrochlorothiazide  Home Medications   Current Outpatient Rx  Name  Route  Sig  Dispense  Refill  . acetaminophen (TYLENOL) 325 MG tablet   Oral   Take 650 mg by mouth 2 (two) times daily.         . fluticasone (FLONASE) 50 MCG/ACT nasal spray   Nasal   Place 2 sprays into the nose every morning.         . furosemide (LASIX) 40 MG tablet   Oral   Take 40 mg by mouth every morning.         Marland Kitchen glucosamine-chondroitin 500-400 MG tablet   Oral   Take 2 tablets by mouth 2 (two) times daily.         Marland Kitchen HYDROcodone-acetaminophen (NORCO/VICODIN) 5-325 MG per tablet   Oral   Take 1 tablet by mouth every morning.         . irbesartan (AVAPRO) 150 MG tablet   Oral   Take 150 mg by mouth every morning.         Marland Kitchen omeprazole (PRILOSEC) 20  MG capsule   Oral   Take 20 mg by mouth every morning.         . potassium chloride (K-DUR,KLOR-CON) 10 MEQ tablet   Oral   Take 20 mEq by mouth every morning.         . Vitamin D, Ergocalciferol, (DRISDOL) 50000 UNITS CAPS   Oral   Take 50,000 Units by mouth every 30 (thirty) days. Takes on the 15th of every month           BP 172/75  Pulse 79  Temp(Src) 97.8 F (36.6 C) (Oral)  Resp 18  SpO2 96%  Physical Exam  Constitutional: She is oriented to person, place, and time. She appears well-developed and well-nourished. No distress.  HENT:  Head: Normocephalic and atraumatic.  Right Ear: Decreased hearing is noted.  Left Ear: Tympanic membrane and ear canal normal.  Nose: Nose normal.  Mouth/Throat: Oropharynx is clear and moist and mucous membranes are normal.  Cerumen impaction right canal  Eyes: Conjunctivae and EOM  are normal. Pupils are equal, round, and reactive to light.  Neck: Normal range of motion. Neck supple.  Cardiovascular: Normal rate, regular rhythm, S1 normal and S2 normal.  Exam reveals no gallop and no friction rub.   No murmur heard. Pulmonary/Chest: Effort normal and breath sounds normal. No respiratory distress. She exhibits no tenderness.  Abdominal: Soft. Normal appearance and bowel sounds are normal. There is no hepatosplenomegaly. There is no tenderness. There is no rebound, no guarding, no tenderness at McBurney's point and negative Murphy's sign. No hernia.  Musculoskeletal: Normal range of motion.  Neurological: She is alert and oriented to person, place, and time. She has normal strength. No cranial nerve deficit or sensory deficit. Coordination normal. GCS eye subscore is 4. GCS verbal subscore is 5. GCS motor subscore is 6.  Skin: Skin is warm, dry and intact. No rash noted. No cyanosis.     Psychiatric: She has a normal mood and affect. Her speech is normal and behavior is normal. Thought content normal.    ED Course  Procedures (including critical care time)  Labs Reviewed  BASIC METABOLIC PANEL - Abnormal; Notable for the following:    GFR calc non Af Amer 76 (*)    GFR calc Af Amer 89 (*)    All other components within normal limits  CBC WITH DIFFERENTIAL  PROTIME-INR      Diagnosis: Venous insufficiency; suspected peripheral arterial disease    MDM  Patient sent to the ER from the nursing home for evaluation of swelling and discoloration of the legs. Patient reports that she has a blister on the back of her right lower leg. This is just a period. She does not ambulate, and sits in a wheelchair. It's not clear if there was any trauma or friction/rubbing to the area. As might be secondary to her venous insufficiency, although she does not feel like her swelling is any significantly worsened compared to her baseline.  Examination reveals cyanosis and duskiness of  the left foot from ankle down. This foot is cooler to touch than the right. I could not palpate pulses, but with bed side Doppler could hear strong impulses.  Positive clinical findings, venous evaluation of the left leg was performed to rule out venous clot burden causing color change. She was negative for DVT. I was unable to evaluate ABI or further arterial studies because it is after hours. I therefore discussed the case with Dr. Imogene Burn, on-call for vascular surgery. After  describing the current findings including examination, he did not feel the patient needed any emergent interventions. He recommends outpatient followup in the office.        Gilda Crease, MD 04/04/13 [phone]

## 2013-04-04 NOTE — ED Notes (Signed)
Discharge plans discussed with [name], niece. Call was approved by patient. [name] was informed of patients need to follow up with the referred physician and to continue ear irrigation to be completed by nursing staff at her care facility.

## 2013-04-04 NOTE — ED Notes (Signed)
[facility] called to transport pt home. Awaiting arrival.

## 2013-04-04 NOTE — Progress Notes (Signed)
*  PRELIMINARY RESULTS* Vascular Ultrasound Left lower extremity venous duplex has been completed.  Preliminary findings: Left:  No evidence of DVT, superficial thrombosis, or Baker's cyst.  Farrel Demark, RDMS, RVT  04/04/2013, 6:17 PM

## 2013-04-09 ENCOUNTER — Other Ambulatory Visit: Payer: Self-pay | Admitting: *Deleted

## 2013-04-09 ENCOUNTER — Telehealth: Payer: Self-pay

## 2013-04-09 DIAGNOSIS — I872 Venous insufficiency (chronic) (peripheral): Secondary | ICD-10-CM

## 2013-04-09 NOTE — Telephone Encounter (Signed)
Phone call from the nurse at [facility].  Reports pt. Was seen in the ER at [facility] on 04/04/13, and was instructed to follow up with appt. ASAP for venous insufficiency.  States rec'd. appt. for 05/10/13 with Dr. Imogene Burn.  Nurse requesting to move patient's appt. to earlier date; reports that the blister on (R) lower extremity has opened-up, (R) lower leg now red, and has very faint pulses in (R) LE.   Denies any fever or chills.  States the MD at [facility] has placed pt. On an oral antibiotic.  States her feet have a cyanotic color for past several days.  Advised nurse that our office will contact the nursing home to work pt. In to another MD schedule at an earlier date.

## 2013-04-22 ENCOUNTER — Encounter: Payer: Self-pay | Admitting: Vascular Surgery

## 2013-04-23 ENCOUNTER — Encounter (INDEPENDENT_AMBULATORY_CARE_PROVIDER_SITE_OTHER): Payer: Medicare Other | Admitting: *Deleted

## 2013-04-23 ENCOUNTER — Encounter: Payer: Self-pay | Admitting: Vascular Surgery

## 2013-04-23 ENCOUNTER — Ambulatory Visit (INDEPENDENT_AMBULATORY_CARE_PROVIDER_SITE_OTHER): Payer: Medicare Other | Admitting: Vascular Surgery

## 2013-04-23 VITALS — BP 114/74 | HR 83 | Ht 60.0 in | Wt 130.0 lb

## 2013-04-23 DIAGNOSIS — I83893 Varicose veins of bilateral lower extremities with other complications: Secondary | ICD-10-CM

## 2013-04-23 DIAGNOSIS — I872 Venous insufficiency (chronic) (peripheral): Secondary | ICD-10-CM

## 2013-04-23 NOTE — Progress Notes (Signed)
Subjective:     Patient ID: [name], female   DOB: [birth year], 79 y.o.   MRN: [id number]  HPI this 79 year old female was referred from the emergency department. She lives at [facility]. She was seen in the emergency department on 04/04/2013 with bluish discoloration of the left foot which was chronic and a blister on her right heel. DVT was ruled out in the left leg. She was felt possibly to have some mild arterial insufficiency. She was referred here for further evaluation. She denies a history of DVT but she is a poor historian. She states both he had been discolored for a long time. She denies pain or numbness in the feet.  Past Medical History  Diagnosis Date  . Osteoarthrosis, unspecified whether generalized or localized, unspecified site   . Hypertension   . Crohn's disease   . Anemia, unspecified   . Senile dementia, uncomplicated   . Sepsis(995.91)   . Pneumonia, organism unspecified   . Intestinal infection due to Clostridium difficile   . Urinary tract infection, site not specified   . Intestinovesical fistula   . Unspecified venous (peripheral) insufficiency   . Peripheral vascular disease, unspecified   . Acute venous embolism and thrombosis of deep vessels of distal lower extremity   . Esophageal reflux   . Unspecified constipation   . Infection with microorganisms resistant to other specified drugs without mention of resistance to multiple drugs   . Oliguria and anuria   . Diarrhea   . Osteoporosis, unspecified   . Allergic rhinitis, cause unspecified     History  Substance Use Topics  . Smoking status: Former Games developer  . Smokeless tobacco: Never Used  . Alcohol Use: No    History reviewed. No pertinent family history.  Allergies  Allergen Reactions  . Hydrochlorothiazide     Current outpatient prescriptions:acetaminophen (TYLENOL) 325 MG tablet, Take 650 mg by mouth 2 (two) times daily., Disp: , Rfl: ;  calcium carbonate (TITRALAC) 420 MG CHEW,  Chew 500 mg by mouth daily., Disp: , Rfl: ;  fluticasone (FLONASE) 50 MCG/ACT nasal spray, Place 2 sprays into the nose every morning., Disp: , Rfl: ;  furosemide (LASIX) 40 MG tablet, Take 40 mg by mouth every morning., Disp: , Rfl:  Glucosamine Sulfate 1000 MG CAPS, Take 1 capsule by mouth 2 (two) times daily., Disp: , Rfl: ;  guaiFENesin (ROBITUSSIN) 100 MG/5ML liquid, Take 100 mg by mouth 3 (three) times daily as needed for cough., Disp: , Rfl: ;  guaiFENesin-dextromethorphan (ROBITUSSIN DM) 100-10 MG/5ML syrup, Take 5 mLs by mouth 3 (three) times daily as needed for cough., Disp: , Rfl:  HYDROcodone-acetaminophen (NORCO/VICODIN) 5-325 MG per tablet, Take 1 tablet by mouth every morning., Disp: , Rfl: ;  HYDROcodone-acetaminophen (VICODIN) 5-500 MG per tablet, Take 1 tablet by mouth every 6 (six) hours as needed for pain., Disp: , Rfl: ;  hydroxypropyl methylcellulose (ISOPTO TEARS) 2.5 % ophthalmic solution, Place 1 drop into both eyes 4 (four) times daily as needed., Disp: , Rfl:  irbesartan (AVAPRO) 150 MG tablet, Take 150 mg by mouth every morning., Disp: , Rfl: ;  magnesium hydroxide (MILK OF MAGNESIA) 400 MG/5ML suspension, Take by mouth daily as needed for constipation., Disp: , Rfl: ;  omeprazole (PRILOSEC) 20 MG capsule, Take 20 mg by mouth every morning., Disp: , Rfl: ;  Polyethylene POWD, by Does not apply route., Disp: , Rfl:  potassium chloride (K-DUR,KLOR-CON) 10 MEQ tablet, Take 20 mEq by mouth every morning.,  Disp: , Rfl: ;  Vitamin D, Ergocalciferol, (DRISDOL) 50000 UNITS CAPS, Take 50,000 Units by mouth every 30 (thirty) days. Takes on the 15th of every month, Disp: , Rfl: ;  glucosamine-chondroitin 500-400 MG tablet, Take 2 tablets by mouth 2 (two) times daily., Disp: , Rfl:   BP 114/74  Pulse 83  Ht 5' (1.524 m)  Wt 130 lb (58.968 kg)  BMI 25.39 kg/m2  SpO2 100%  Body mass index is 25.39 kg/(m^2).           Review of Systems patient is nonambulatory in a wheelchair.  She has multiple joint complaints. Denies active chest pain or dyspnea on exertion. No history of diabetes mellitus.     Objective:   Physical Exam blood pressure 114/74 heart rate 83 respirations 16 Gen.-alert and oriented x3 in no apparent distress-elderly and chronically ill-on a stretcher-nonambulatory HEENT normal for age Lungs no rhonchi or wheezing Cardiovascular regular rhythm no murmurs carotid pulses 3+ palpable no bruits audible Abdomen soft nontender no palpable masses-obese Musculoskeletal free of  major deformities Skin right leg with reddish discoloration midcalf distally but no active ulcers or blisters no evidence of ischemia-left leg with bluish discoloration from the ankle distally no pain with intact motion and sensation Neurologic normal Lower extremities 3+ femoral and 2+ popliteal pulses palpable. Loud biphasic flow in the right PT DP and left PT  Today I were to venous duplex exam of the right leg which are reviewed and interpreted. There is chronic thrombosis of the right great saphenous vein and it appears that there has been an old DVT in the right leg and superficial femoral vein. 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

## 2013-05-10 ENCOUNTER — Encounter: Payer: Medicare Other | Admitting: Vascular Surgery

## 2013-07-31 ENCOUNTER — Encounter (HOSPITAL_BASED_OUTPATIENT_CLINIC_OR_DEPARTMENT_OTHER): Payer: Medicare Other | Attending: General Surgery

## 2013-07-31 DIAGNOSIS — L97309 Non-pressure chronic ulcer of unspecified ankle with unspecified severity: Secondary | ICD-10-CM | POA: Insufficient documentation

## 2013-07-31 DIAGNOSIS — Z86718 Personal history of other venous thrombosis and embolism: Secondary | ICD-10-CM | POA: Insufficient documentation

## 2013-07-31 DIAGNOSIS — I739 Peripheral vascular disease, unspecified: Secondary | ICD-10-CM | POA: Insufficient documentation

## 2013-07-31 DIAGNOSIS — I10 Essential (primary) hypertension: Secondary | ICD-10-CM | POA: Insufficient documentation

## 2013-08-01 NOTE — Progress Notes (Signed)
[facility]  NAME:  SARAGRACE, SELKE              ACCOUNT NO.:  [id number]  MEDICAL RECORD NO.:  [id number]      DATE OF BIRTH:  [birth year]  PHYSICIAN:  Ardath Sax, M.D.           VISIT DATE:                                  OFFICE VISIT   She is an 79 year old lady who comes to [place] with an ulcer over her medial right ankle.  She has had this problem for several months and her doctor sent her for venous study with Dr. Arbie Cookey, and it showed some thrombus in both of her femoral veins.  It also showed on the arterial study that she had severe vascular problems with both of her legs.  When she came in today, her blood pressure was 91/64, respirations 16, pulse 86, temperature 98.2.  She weighs 171 pounds.  She has a history of hypertension and peripheral vascular disease.  Her medicines are Lasix, hydrocodone, potassium, Tylenol, gabapentin, glucosamine, doxycycline. She says that she has a history of a compression fracture to her spine, deep venous thrombosis of both of her legs and a pleural effusion in the past.  When I examined her, I found a 1-cm ulcer on the medial right ankle, and I could not feel any pulses on either foot.  She will be treated with Santyl dressings daily, and we will see her back here in a week.  DIAGNOSIS: 1. A 1-cm ulcer on the medial right ankle probably due to ischemia.  OTHER DIAGNOSES: 1. History of deep venous thrombosis. 2. Hypertension. 3. Peripheral vascular disease, bilateral.     Ardath Sax, M.D.     PP/MEDQ  D:  07/31/2013  T:  08/01/2013  Job:  [id number]

## 2013-09-04 ENCOUNTER — Encounter (HOSPITAL_BASED_OUTPATIENT_CLINIC_OR_DEPARTMENT_OTHER): Payer: Medicare Other | Attending: General Surgery

## 2013-09-04 DIAGNOSIS — I739 Peripheral vascular disease, unspecified: Secondary | ICD-10-CM | POA: Insufficient documentation

## 2013-09-04 DIAGNOSIS — L97809 Non-pressure chronic ulcer of other part of unspecified lower leg with unspecified severity: Secondary | ICD-10-CM | POA: Insufficient documentation

## 2013-09-04 DIAGNOSIS — I872 Venous insufficiency (chronic) (peripheral): Secondary | ICD-10-CM | POA: Insufficient documentation

## 2013-09-25 ENCOUNTER — Encounter (HOSPITAL_BASED_OUTPATIENT_CLINIC_OR_DEPARTMENT_OTHER): Payer: Medicare Other | Attending: General Surgery

## 2013-09-25 DIAGNOSIS — R21 Rash and other nonspecific skin eruption: Secondary | ICD-10-CM | POA: Insufficient documentation

## 2013-09-25 DIAGNOSIS — I872 Venous insufficiency (chronic) (peripheral): Secondary | ICD-10-CM | POA: Insufficient documentation

## 2013-09-25 DIAGNOSIS — I87309 Chronic venous hypertension (idiopathic) without complications of unspecified lower extremity: Secondary | ICD-10-CM | POA: Insufficient documentation

## 2013-09-25 DIAGNOSIS — L97809 Non-pressure chronic ulcer of other part of unspecified lower leg with unspecified severity: Secondary | ICD-10-CM | POA: Insufficient documentation

## 2013-09-25 DIAGNOSIS — I739 Peripheral vascular disease, unspecified: Secondary | ICD-10-CM | POA: Insufficient documentation

## 2013-11-05 ENCOUNTER — Encounter (HOSPITAL_BASED_OUTPATIENT_CLINIC_OR_DEPARTMENT_OTHER): Payer: Medicare Other | Attending: General Surgery

## 2013-11-05 DIAGNOSIS — L8992 Pressure ulcer of unspecified site, stage 2: Secondary | ICD-10-CM | POA: Insufficient documentation

## 2013-11-05 DIAGNOSIS — L89899 Pressure ulcer of other site, unspecified stage: Secondary | ICD-10-CM | POA: Insufficient documentation

## 2013-11-05 DIAGNOSIS — L97809 Non-pressure chronic ulcer of other part of unspecified lower leg with unspecified severity: Secondary | ICD-10-CM | POA: Insufficient documentation

## 2013-11-05 DIAGNOSIS — I739 Peripheral vascular disease, unspecified: Secondary | ICD-10-CM | POA: Insufficient documentation

## 2013-11-13 ENCOUNTER — Encounter (HOSPITAL_BASED_OUTPATIENT_CLINIC_OR_DEPARTMENT_OTHER): Payer: Medicare Other

## 2013-12-04 ENCOUNTER — Encounter (HOSPITAL_BASED_OUTPATIENT_CLINIC_OR_DEPARTMENT_OTHER): Payer: Medicare Other | Attending: General Surgery

## 2013-12-04 DIAGNOSIS — L97909 Non-pressure chronic ulcer of unspecified part of unspecified lower leg with unspecified severity: Secondary | ICD-10-CM | POA: Insufficient documentation

## 2013-12-04 DIAGNOSIS — I87319 Chronic venous hypertension (idiopathic) with ulcer of unspecified lower extremity: Secondary | ICD-10-CM | POA: Insufficient documentation

## 2014-01-08 ENCOUNTER — Encounter (HOSPITAL_BASED_OUTPATIENT_CLINIC_OR_DEPARTMENT_OTHER): Payer: Medicare Other | Attending: General Surgery

## 2014-01-08 DIAGNOSIS — L97909 Non-pressure chronic ulcer of unspecified part of unspecified lower leg with unspecified severity: Principal | ICD-10-CM

## 2014-01-08 DIAGNOSIS — I87339 Chronic venous hypertension (idiopathic) with ulcer and inflammation of unspecified lower extremity: Secondary | ICD-10-CM | POA: Insufficient documentation

## 2014-01-22 DIAGNOSIS — I87339 Chronic venous hypertension (idiopathic) with ulcer and inflammation of unspecified lower extremity: Secondary | ICD-10-CM | POA: Diagnosis not present

## 2014-02-12 ENCOUNTER — Encounter (HOSPITAL_BASED_OUTPATIENT_CLINIC_OR_DEPARTMENT_OTHER): Payer: Medicare Other | Attending: General Surgery

## 2014-02-12 DIAGNOSIS — L97809 Non-pressure chronic ulcer of other part of unspecified lower leg with unspecified severity: Secondary | ICD-10-CM | POA: Insufficient documentation

## 2014-03-05 ENCOUNTER — Encounter (HOSPITAL_BASED_OUTPATIENT_CLINIC_OR_DEPARTMENT_OTHER): Payer: Medicare Other | Attending: General Surgery

## 2014-03-05 DIAGNOSIS — L97809 Non-pressure chronic ulcer of other part of unspecified lower leg with unspecified severity: Secondary | ICD-10-CM | POA: Insufficient documentation

## 2014-03-19 DIAGNOSIS — L97809 Non-pressure chronic ulcer of other part of unspecified lower leg with unspecified severity: Secondary | ICD-10-CM | POA: Diagnosis not present

## 2014-04-02 ENCOUNTER — Encounter (HOSPITAL_BASED_OUTPATIENT_CLINIC_OR_DEPARTMENT_OTHER): Payer: Medicare Other | Attending: General Surgery

## 2014-04-02 DIAGNOSIS — L97809 Non-pressure chronic ulcer of other part of unspecified lower leg with unspecified severity: Secondary | ICD-10-CM | POA: Insufficient documentation

## 2014-04-30 ENCOUNTER — Encounter (HOSPITAL_BASED_OUTPATIENT_CLINIC_OR_DEPARTMENT_OTHER): Payer: Medicare Other | Attending: General Surgery

## 2014-04-30 DIAGNOSIS — L97809 Non-pressure chronic ulcer of other part of unspecified lower leg with unspecified severity: Secondary | ICD-10-CM | POA: Diagnosis present

## 2014-05-07 DIAGNOSIS — L97809 Non-pressure chronic ulcer of other part of unspecified lower leg with unspecified severity: Secondary | ICD-10-CM | POA: Diagnosis not present

## 2014-05-16 DIAGNOSIS — L97809 Non-pressure chronic ulcer of other part of unspecified lower leg with unspecified severity: Secondary | ICD-10-CM | POA: Diagnosis not present

## 2014-05-23 DIAGNOSIS — L97809 Non-pressure chronic ulcer of other part of unspecified lower leg with unspecified severity: Secondary | ICD-10-CM | POA: Diagnosis not present

## 2014-05-30 ENCOUNTER — Encounter (HOSPITAL_BASED_OUTPATIENT_CLINIC_OR_DEPARTMENT_OTHER): Payer: Medicare Other | Attending: General Surgery

## 2014-05-30 DIAGNOSIS — L97809 Non-pressure chronic ulcer of other part of unspecified lower leg with unspecified severity: Secondary | ICD-10-CM | POA: Insufficient documentation

## 2014-06-06 DIAGNOSIS — L97809 Non-pressure chronic ulcer of other part of unspecified lower leg with unspecified severity: Secondary | ICD-10-CM | POA: Diagnosis not present

## 2014-06-13 DIAGNOSIS — L97809 Non-pressure chronic ulcer of other part of unspecified lower leg with unspecified severity: Secondary | ICD-10-CM | POA: Diagnosis not present

## 2014-06-20 DIAGNOSIS — L97809 Non-pressure chronic ulcer of other part of unspecified lower leg with unspecified severity: Secondary | ICD-10-CM | POA: Diagnosis not present

## 2014-06-26 ENCOUNTER — Encounter (HOSPITAL_BASED_OUTPATIENT_CLINIC_OR_DEPARTMENT_OTHER): Payer: Medicare Other | Attending: General Surgery

## 2014-06-26 DIAGNOSIS — L97809 Non-pressure chronic ulcer of other part of unspecified lower leg with unspecified severity: Secondary | ICD-10-CM | POA: Diagnosis not present

## 2014-07-04 DIAGNOSIS — L97809 Non-pressure chronic ulcer of other part of unspecified lower leg with unspecified severity: Secondary | ICD-10-CM | POA: Diagnosis not present

## 2014-07-11 DIAGNOSIS — L97809 Non-pressure chronic ulcer of other part of unspecified lower leg with unspecified severity: Secondary | ICD-10-CM | POA: Diagnosis not present

## 2014-07-18 DIAGNOSIS — L97809 Non-pressure chronic ulcer of other part of unspecified lower leg with unspecified severity: Secondary | ICD-10-CM | POA: Diagnosis not present

## 2014-07-25 DIAGNOSIS — L97809 Non-pressure chronic ulcer of other part of unspecified lower leg with unspecified severity: Secondary | ICD-10-CM | POA: Diagnosis not present

## 2014-08-01 ENCOUNTER — Encounter (HOSPITAL_BASED_OUTPATIENT_CLINIC_OR_DEPARTMENT_OTHER): Payer: Medicare Other | Attending: General Surgery

## 2014-08-01 DIAGNOSIS — L97809 Non-pressure chronic ulcer of other part of unspecified lower leg with unspecified severity: Secondary | ICD-10-CM | POA: Diagnosis not present

## 2014-08-01 DIAGNOSIS — I87339 Chronic venous hypertension (idiopathic) with ulcer and inflammation of unspecified lower extremity: Secondary | ICD-10-CM | POA: Diagnosis not present

## 2014-08-01 DIAGNOSIS — L8992 Pressure ulcer of unspecified site, stage 2: Secondary | ICD-10-CM | POA: Diagnosis not present

## 2014-08-01 DIAGNOSIS — L89899 Pressure ulcer of other site, unspecified stage: Secondary | ICD-10-CM | POA: Diagnosis present

## 2014-08-01 DIAGNOSIS — L97909 Non-pressure chronic ulcer of unspecified part of unspecified lower leg with unspecified severity: Secondary | ICD-10-CM

## 2014-08-08 DIAGNOSIS — I87339 Chronic venous hypertension (idiopathic) with ulcer and inflammation of unspecified lower extremity: Secondary | ICD-10-CM | POA: Diagnosis not present

## 2014-08-08 DIAGNOSIS — L97809 Non-pressure chronic ulcer of other part of unspecified lower leg with unspecified severity: Secondary | ICD-10-CM | POA: Diagnosis not present

## 2014-08-08 DIAGNOSIS — L8992 Pressure ulcer of unspecified site, stage 2: Secondary | ICD-10-CM | POA: Diagnosis not present

## 2014-08-08 DIAGNOSIS — L89899 Pressure ulcer of other site, unspecified stage: Secondary | ICD-10-CM | POA: Diagnosis not present

## 2014-08-15 DIAGNOSIS — I87339 Chronic venous hypertension (idiopathic) with ulcer and inflammation of unspecified lower extremity: Secondary | ICD-10-CM | POA: Diagnosis not present

## 2014-08-15 DIAGNOSIS — L89899 Pressure ulcer of other site, unspecified stage: Secondary | ICD-10-CM | POA: Diagnosis not present

## 2014-08-15 DIAGNOSIS — L97909 Non-pressure chronic ulcer of unspecified part of unspecified lower leg with unspecified severity: Secondary | ICD-10-CM | POA: Diagnosis not present

## 2014-08-15 DIAGNOSIS — L97809 Non-pressure chronic ulcer of other part of unspecified lower leg with unspecified severity: Secondary | ICD-10-CM | POA: Diagnosis not present

## 2014-08-15 DIAGNOSIS — L8992 Pressure ulcer of unspecified site, stage 2: Secondary | ICD-10-CM | POA: Diagnosis not present

## 2014-08-22 DIAGNOSIS — L97809 Non-pressure chronic ulcer of other part of unspecified lower leg with unspecified severity: Secondary | ICD-10-CM | POA: Diagnosis not present

## 2014-08-22 DIAGNOSIS — L97909 Non-pressure chronic ulcer of unspecified part of unspecified lower leg with unspecified severity: Secondary | ICD-10-CM | POA: Diagnosis not present

## 2014-08-22 DIAGNOSIS — I87339 Chronic venous hypertension (idiopathic) with ulcer and inflammation of unspecified lower extremity: Secondary | ICD-10-CM | POA: Diagnosis not present

## 2014-08-22 DIAGNOSIS — L89899 Pressure ulcer of other site, unspecified stage: Secondary | ICD-10-CM | POA: Diagnosis not present

## 2014-08-22 DIAGNOSIS — L8992 Pressure ulcer of unspecified site, stage 2: Secondary | ICD-10-CM | POA: Diagnosis not present

## 2014-08-29 ENCOUNTER — Encounter (HOSPITAL_BASED_OUTPATIENT_CLINIC_OR_DEPARTMENT_OTHER): Payer: Medicare Other | Attending: General Surgery

## 2014-08-29 DIAGNOSIS — L97509 Non-pressure chronic ulcer of other part of unspecified foot with unspecified severity: Secondary | ICD-10-CM | POA: Insufficient documentation

## 2014-08-29 DIAGNOSIS — I872 Venous insufficiency (chronic) (peripheral): Secondary | ICD-10-CM | POA: Insufficient documentation

## 2014-08-29 DIAGNOSIS — L97909 Non-pressure chronic ulcer of unspecified part of unspecified lower leg with unspecified severity: Secondary | ICD-10-CM | POA: Insufficient documentation

## 2014-09-05 DIAGNOSIS — I872 Venous insufficiency (chronic) (peripheral): Secondary | ICD-10-CM | POA: Diagnosis not present

## 2014-09-05 DIAGNOSIS — L97909 Non-pressure chronic ulcer of unspecified part of unspecified lower leg with unspecified severity: Secondary | ICD-10-CM | POA: Diagnosis not present

## 2014-09-05 DIAGNOSIS — L97509 Non-pressure chronic ulcer of other part of unspecified foot with unspecified severity: Secondary | ICD-10-CM | POA: Diagnosis not present

## 2014-09-12 DIAGNOSIS — L97909 Non-pressure chronic ulcer of unspecified part of unspecified lower leg with unspecified severity: Secondary | ICD-10-CM | POA: Diagnosis not present

## 2014-09-12 DIAGNOSIS — L97509 Non-pressure chronic ulcer of other part of unspecified foot with unspecified severity: Secondary | ICD-10-CM | POA: Diagnosis not present

## 2014-09-12 DIAGNOSIS — I872 Venous insufficiency (chronic) (peripheral): Secondary | ICD-10-CM | POA: Diagnosis not present

## 2014-09-19 DIAGNOSIS — I872 Venous insufficiency (chronic) (peripheral): Secondary | ICD-10-CM | POA: Diagnosis not present

## 2014-09-19 DIAGNOSIS — L97509 Non-pressure chronic ulcer of other part of unspecified foot with unspecified severity: Secondary | ICD-10-CM | POA: Diagnosis not present

## 2014-09-19 DIAGNOSIS — L97909 Non-pressure chronic ulcer of unspecified part of unspecified lower leg with unspecified severity: Secondary | ICD-10-CM | POA: Diagnosis not present

## 2014-09-26 ENCOUNTER — Encounter (HOSPITAL_BASED_OUTPATIENT_CLINIC_OR_DEPARTMENT_OTHER): Payer: Medicare Other | Attending: General Surgery

## 2014-09-26 DIAGNOSIS — L97919 Non-pressure chronic ulcer of unspecified part of right lower leg with unspecified severity: Secondary | ICD-10-CM | POA: Insufficient documentation

## 2014-09-26 DIAGNOSIS — I87331 Chronic venous hypertension (idiopathic) with ulcer and inflammation of right lower extremity: Secondary | ICD-10-CM | POA: Insufficient documentation

## 2014-10-03 DIAGNOSIS — I87331 Chronic venous hypertension (idiopathic) with ulcer and inflammation of right lower extremity: Secondary | ICD-10-CM | POA: Diagnosis not present

## 2014-10-03 DIAGNOSIS — L97919 Non-pressure chronic ulcer of unspecified part of right lower leg with unspecified severity: Secondary | ICD-10-CM | POA: Diagnosis not present

## 2014-10-17 DIAGNOSIS — I87331 Chronic venous hypertension (idiopathic) with ulcer and inflammation of right lower extremity: Secondary | ICD-10-CM | POA: Diagnosis not present

## 2014-10-17 DIAGNOSIS — L97919 Non-pressure chronic ulcer of unspecified part of right lower leg with unspecified severity: Secondary | ICD-10-CM | POA: Diagnosis not present

## 2014-10-31 ENCOUNTER — Encounter (HOSPITAL_BASED_OUTPATIENT_CLINIC_OR_DEPARTMENT_OTHER): Payer: Medicare Other | Attending: General Surgery

## 2014-10-31 DIAGNOSIS — L97511 Non-pressure chronic ulcer of other part of right foot limited to breakdown of skin: Secondary | ICD-10-CM | POA: Insufficient documentation

## 2014-10-31 DIAGNOSIS — I87331 Chronic venous hypertension (idiopathic) with ulcer and inflammation of right lower extremity: Secondary | ICD-10-CM | POA: Insufficient documentation

## 2014-10-31 DIAGNOSIS — B9562 Methicillin resistant Staphylococcus aureus infection as the cause of diseases classified elsewhere: Secondary | ICD-10-CM | POA: Diagnosis not present

## 2014-11-07 DIAGNOSIS — B9562 Methicillin resistant Staphylococcus aureus infection as the cause of diseases classified elsewhere: Secondary | ICD-10-CM | POA: Diagnosis not present

## 2014-11-07 DIAGNOSIS — I87331 Chronic venous hypertension (idiopathic) with ulcer and inflammation of right lower extremity: Secondary | ICD-10-CM | POA: Diagnosis not present

## 2014-11-07 DIAGNOSIS — L97511 Non-pressure chronic ulcer of other part of right foot limited to breakdown of skin: Secondary | ICD-10-CM | POA: Diagnosis not present

## 2014-11-28 ENCOUNTER — Encounter (HOSPITAL_BASED_OUTPATIENT_CLINIC_OR_DEPARTMENT_OTHER): Payer: Medicare Other | Attending: Internal Medicine

## 2014-11-28 DIAGNOSIS — L97519 Non-pressure chronic ulcer of other part of right foot with unspecified severity: Secondary | ICD-10-CM | POA: Diagnosis present

## 2014-12-12 DIAGNOSIS — L97519 Non-pressure chronic ulcer of other part of right foot with unspecified severity: Secondary | ICD-10-CM | POA: Diagnosis not present

## 2015-01-02 ENCOUNTER — Encounter (HOSPITAL_BASED_OUTPATIENT_CLINIC_OR_DEPARTMENT_OTHER): Payer: Medicare Other | Attending: Internal Medicine

## 2015-01-02 DIAGNOSIS — L97511 Non-pressure chronic ulcer of other part of right foot limited to breakdown of skin: Secondary | ICD-10-CM | POA: Insufficient documentation

## 2015-01-02 DIAGNOSIS — M2041 Other hammer toe(s) (acquired), right foot: Secondary | ICD-10-CM | POA: Diagnosis not present

## 2015-01-09 DIAGNOSIS — L97511 Non-pressure chronic ulcer of other part of right foot limited to breakdown of skin: Secondary | ICD-10-CM | POA: Diagnosis not present

## 2015-01-09 DIAGNOSIS — M2041 Other hammer toe(s) (acquired), right foot: Secondary | ICD-10-CM | POA: Diagnosis not present

## 2015-01-23 DIAGNOSIS — M2041 Other hammer toe(s) (acquired), right foot: Secondary | ICD-10-CM | POA: Diagnosis not present

## 2015-01-23 DIAGNOSIS — L97511 Non-pressure chronic ulcer of other part of right foot limited to breakdown of skin: Secondary | ICD-10-CM | POA: Diagnosis not present

## 2015-02-06 ENCOUNTER — Encounter (HOSPITAL_BASED_OUTPATIENT_CLINIC_OR_DEPARTMENT_OTHER): Payer: Medicare Other | Attending: Internal Medicine

## 2017-08-03 ENCOUNTER — Encounter (HOSPITAL_COMMUNITY): Payer: Self-pay | Admitting: Emergency Medicine

## 2017-08-03 ENCOUNTER — Emergency Department (HOSPITAL_COMMUNITY)
Admission: EM | Admit: 2017-08-03 | Discharge: 2017-08-03 | Disposition: A | Discharge status: Home / Self Care | Payer: Medicare Other | Attending: Emergency Medicine | Admitting: Emergency Medicine

## 2017-08-03 ENCOUNTER — Emergency Department (HOSPITAL_COMMUNITY): Payer: Medicare Other

## 2017-08-03 DIAGNOSIS — D72829 Elevated white blood cell count, unspecified: Secondary | ICD-10-CM | POA: Insufficient documentation

## 2017-08-03 DIAGNOSIS — R9431 Abnormal electrocardiogram [ECG] [EKG]: Secondary | ICD-10-CM | POA: Diagnosis not present

## 2017-08-03 DIAGNOSIS — R531 Weakness: Secondary | ICD-10-CM | POA: Diagnosis not present

## 2017-08-03 DIAGNOSIS — Z86718 Personal history of other venous thrombosis and embolism: Secondary | ICD-10-CM | POA: Diagnosis not present

## 2017-08-03 DIAGNOSIS — I739 Peripheral vascular disease, unspecified: Secondary | ICD-10-CM | POA: Diagnosis not present

## 2017-08-03 DIAGNOSIS — I10 Essential (primary) hypertension: Secondary | ICD-10-CM | POA: Diagnosis not present

## 2017-08-03 DIAGNOSIS — Z79899 Other long term (current) drug therapy: Secondary | ICD-10-CM | POA: Diagnosis not present

## 2017-08-03 LAB — URINALYSIS, ROUTINE W REFLEX MICROSCOPIC
BILIRUBIN URINE: NEGATIVE
GLUCOSE, UA: NEGATIVE mg/dL
KETONES UR: NEGATIVE mg/dL
Nitrite: NEGATIVE
PROTEIN: NEGATIVE mg/dL
Specific Gravity, Urine: 1.015 (ref 1.005–1.030)
pH: 5 (ref 5.0–8.0)

## 2017-08-03 LAB — CBC WITH DIFFERENTIAL/PLATELET
Basophils Absolute: 0 10*3/uL (ref 0.0–0.1)
Basophils Relative: 0 %
EOS ABS: 0 10*3/uL (ref 0.0–0.7)
EOS PCT: 0 %
HEMATOCRIT: 40.9 % (ref 36.0–46.0)
Hemoglobin: 14.2 g/dL (ref 12.0–15.0)
Lymphocytes Relative: 23 %
Lymphs Abs: 2.4 10*3/uL (ref 0.7–4.0)
MCH: 33 pg (ref 26.0–34.0)
MCHC: 34.7 g/dL (ref 30.0–36.0)
MCV: 95.1 fL (ref 78.0–100.0)
MONO ABS: 0.7 10*3/uL (ref 0.1–1.0)
MONOS PCT: 7 %
Neutro Abs: 7.3 10*3/uL (ref 1.7–7.7)
Neutrophils Relative %: 70 %
PLATELETS: 345 10*3/uL (ref 150–400)
RBC: 4.3 MIL/uL (ref 3.87–5.11)
RDW: 17.6 % — AB (ref 11.5–15.5)
WBC: 10.4 10*3/uL (ref 4.0–10.5)

## 2017-08-03 LAB — I-STAT CHEM 8, ED
BUN: 29 mg/dL — AB (ref 6–20)
CALCIUM ION: 1.12 mmol/L — AB (ref 1.15–1.40)
CREATININE: 1 mg/dL (ref 0.44–1.00)
Chloride: 94 mmol/L — ABNORMAL LOW (ref 101–111)
Glucose, Bld: 80 mg/dL (ref 65–99)
HCT: 46 % (ref 36.0–46.0)
Hemoglobin: 15.6 g/dL — ABNORMAL HIGH (ref 12.0–15.0)
Potassium: 3.9 mmol/L (ref 3.5–5.1)
Sodium: 133 mmol/L — ABNORMAL LOW (ref 135–145)
TCO2: 29 mmol/L (ref 0–100)

## 2017-08-03 LAB — BASIC METABOLIC PANEL
Anion gap: 7 (ref 5–15)
BUN: 29 mg/dL — ABNORMAL HIGH (ref 6–20)
CALCIUM: 8.9 mg/dL (ref 8.9–10.3)
CO2: 32 mmol/L (ref 22–32)
CREATININE: 0.85 mg/dL (ref 0.44–1.00)
Chloride: 97 mmol/L — ABNORMAL LOW (ref 101–111)
GFR calc non Af Amer: 59 mL/min — ABNORMAL LOW (ref >60)
Glucose, Bld: 86 mg/dL (ref 65–99)
Potassium: 4 mmol/L (ref 3.5–5.1)
SODIUM: 136 mmol/L (ref 135–145)

## 2017-08-03 LAB — I-STAT TROPONIN, ED: Troponin i, poc: 0.01 ng/mL (ref 0.00–0.08)

## 2017-08-03 LAB — PROTIME-INR
INR: 1.51
Prothrombin Time: 18.4 seconds — ABNORMAL HIGH (ref 11.4–15.2)

## 2017-08-03 MED ORDER — IOPAMIDOL (ISOVUE-370) INJECTION 76%
INTRAVENOUS | Status: AC
  Administered 2017-08-03: 100 mL
  Filled 2017-08-03: qty 100

## 2017-08-03 MED ORDER — IOPAMIDOL (ISOVUE-370) INJECTION 76%
100.0000 mL | Freq: Once | INTRAVENOUS | Status: AC | PRN
  Administered 2017-08-03: 100 mL via INTRAVENOUS

## 2017-08-03 MED ORDER — SODIUM CHLORIDE 0.9 % IV BOLUS (SEPSIS)
500.0000 mL | Freq: Once | INTRAVENOUS | Status: AC
  Administered 2017-08-03: 500 mL via INTRAVENOUS

## 2017-08-03 MED ORDER — IOPAMIDOL (ISOVUE-370) INJECTION 76%
INTRAVENOUS | Status: AC
  Filled 2017-08-03: qty 100

## 2017-08-03 NOTE — ED Notes (Signed)
Pt able to tolerate PO without difficulty or vomiting.  

## 2017-08-03 NOTE — ED Notes (Signed)
IV infiltrated. MD informed. States that he will performed US IV.

## 2017-08-03 NOTE — Discharge Instructions (Signed)
Follow up with your PCP, they should probably have your see the cardiologist if you have ecg changes.

## 2017-08-03 NOTE — ED Triage Notes (Signed)
Patient here from [facility] with no complaints. Staff reported that WBC was elevated at 14 collected on 7/30. X-ray done 8/8 unremarkable. Staff also reports that O2 sats were 76%, which may have been due to patient's cold hands. DNR.

## 2017-08-03 NOTE — ED Provider Notes (Signed)
I assumed care of this patient from Dr. Adela Lank at 1700.  Please see their note for further details of Hx, PE.  Briefly patient is a 83 y.o. female who presents with hypoxia noted at sNF; also complained of CP. Currently on Xarelto for DVT. Found to have new EKG changes. Labs grossly reassuring with negative trop. Current plan is to follow up CTA to assess for PE.  Pt is palliative. If scan is negative, pt will be discharged back to facility.  CTA negative.   Disposition: Discharge  Condition: Good  I have discussed the results, Dx and Tx plan with the patient who expressed understanding and agree(s) with the plan. Discharge instructions discussed at great length. The patient was given strict return precautions who verbalized understanding of the instructions. No further questions at time of discharge.       Nira Conn, MD 08/03/17 [phone]

## 2017-08-03 NOTE — ED Notes (Signed)
[facility] left with pt. Pt unable

## 2017-08-03 NOTE — ED Notes (Signed)
[facility] called  

## 2017-08-03 NOTE — ED Notes (Signed)
CT states that pt is next for her scan.

## 2017-08-03 NOTE — ED Notes (Signed)
CT made aware that IV is ready

## 2017-08-03 NOTE — ED Provider Notes (Signed)
[facility]-EMERGENCY DEPT Provider Note   CSN: [id number] Arrival date & time: 08/03/17  0902     History   Chief Complaint Chief Complaint  Patient presents with  . Abnormal Lab    HPI [name] is a 83 y.o. female.  83 yo F with a chief complaint of a leukocytosis.The nursing home noted this on routine labs. I ordered a chest x-ray as well as a urine though I do not see the result of missing a paperwork. She has had 2 episodes of vomiting in the past couple days as well as an increased amount of reflux symptoms. The patient is demented and unable to provide further history. States that she feels well and has no complaints.Does not remember the nausea and vomiting. Is unable to explain the reflux like symptoms. Level V caveat dementia. The nursing home noted that the patient had hypoxia down to 76% and so sent her here.  She is being treated for a bilateral DVT.   The history is provided by the patient, the nursing home and medical records.  Abnormal Lab  Illness  This is a new problem. The current episode started 2 days ago. The problem occurs constantly. The problem has not changed since onset.Pertinent negatives include no chest pain, no headaches and no shortness of breath. Nothing aggravates the symptoms. Nothing relieves the symptoms. She has tried nothing for the symptoms. The treatment provided no relief.    Past Medical History:  Diagnosis Date  . Acute venous embolism and thrombosis of deep vessels of distal lower extremity (HCC)   . Allergic rhinitis, cause unspecified   . Anemia, unspecified   . Crohn's disease (HCC)   . Diarrhea   . Esophageal reflux   . Hypertension   . Infection with microorganisms resistant to other specified drugs without mention of resistance to multiple drugs   . Intestinal infection due to Clostridium difficile   . Intestinovesical fistula   . Oliguria and anuria   . Osteoarthrosis, unspecified whether generalized or localized, unspecified  site   . Osteoporosis, unspecified   . Peripheral vascular disease, unspecified (HCC)   . Pneumonia, organism unspecified(486)   . Senile dementia, uncomplicated   . Sepsis(995.91)   . Unspecified constipation   . Unspecified venous (peripheral) insufficiency   . Urinary tract infection, site not specified     Patient Active Problem List   Diagnosis Date Noted  . Unspecified venous (peripheral) insufficiency 04/23/2013  . CONGESTIVE HEART FAILURE 09/18/2007  . HYPERTENSION 07/30/2007  . OSTEOARTHRITIS 07/30/2007    Past Surgical History:  Procedure Laterality Date  . EYE SURGERY     Cataract removal    OB History    No data available       Home Medications    Prior to Admission medications   Medication Sig Start Date End Date Taking? Authorizing Provider  acetaminophen (TYLENOL) 325 MG tablet Take 650 mg by mouth 2 (two) times daily.   Yes [provider]  alum & mag hydroxide-simeth (MAALOX/MYLANTA) 200-200-20 MG/5ML suspension Take 20 mLs by mouth every 8 (eight) hours as needed for indigestion or heartburn.   Yes [provider]  furosemide (LASIX) 80 MG tablet Take 80 mg by mouth daily.   Yes [provider]  gabapentin (NEURONTIN) 300 MG capsule Take 300 mg by mouth at bedtime.   Yes [provider]  Glucosamine Sulfate 1000 MG CAPS Take 1 capsule by mouth 2 (two) times daily.   Yes [provider]  HYDROcodone-acetaminophen (NORCO/VICODIN) 5-325 MG per tablet Take 1 tablet by mouth every morning.   Yes [provider]  Multiple Vitamin (MULTIVITAMIN WITH MINERALS) TABS tablet Take 1 tablet by mouth daily with breakfast.   Yes [provider]  Nutritional Supplements (NUTRITIONAL DRINK) LIQD Take 1 Can by mouth 3 (three) times daily. *Med Pass*   Yes [provider]  omeprazole (PRILOSEC) 20 MG capsule Take 20 mg by mouth 2 (two) times daily.    Yes [provider]  potassium chloride  (K-DUR,KLOR-CON) 10 MEQ tablet Take 20 mEq by mouth every morning.   Yes [provider]  Rivaroxaban (XARELTO) 15 MG TABS tablet Take 15 mg by mouth 2 (two) times daily with a meal. Started 07/20 to 08/10   Yes [provider]  rivaroxaban (XARELTO) 20 MG TABS tablet Take 20 mg by mouth daily with supper.   Yes [provider]  Vitamin D, Ergocalciferol, (DRISDOL) 50000 UNITS CAPS Take 50,000 Units by mouth every 30 (thirty) days. Takes on the 15th of every month   Yes [provider]    Family History No family history on file.  Social History Social History  Substance Use Topics  . Smoking status: Former Games developer  . Smokeless tobacco: Never Used  . Alcohol use No     Allergies   Hydrochlorothiazide   Review of Systems Review of Systems  Constitutional: Negative for chills and fever.  HENT: Negative for congestion and rhinorrhea.   Eyes: Negative for redness and visual disturbance.  Respiratory: Negative for shortness of breath and wheezing.   Cardiovascular: Negative for chest pain and palpitations.  Gastrointestinal: Negative for nausea and vomiting.  Genitourinary: Negative for dysuria and urgency.  Musculoskeletal: Negative for arthralgias and myalgias.  Skin: Negative for pallor and wound.  Neurological: Negative for dizziness and headaches.     Physical Exam Updated Vital Signs BP 95/75 (BP Location: Left Arm)   Pulse (!) 106   Temp (!) 97.5 F (36.4 C) (Oral)   Resp (!) 21   SpO2 94%   Physical Exam  Constitutional: She is oriented to person, place, and time. She appears well-developed and well-nourished. No distress.  HENT:  Head: Normocephalic and atraumatic.  Eyes: Pupils are equal, round, and reactive to light. EOM are normal.  Neck: Normal range of motion. Neck supple.  Cardiovascular: Regular rhythm.  Tachycardia present.  Exam reveals no gallop and no friction rub.   No murmur heard. Pulmonary/Chest: Effort  normal. She has no wheezes. She has no rales.  Abdominal: Soft. She exhibits no distension. There is no tenderness.  Musculoskeletal: She exhibits no edema or tenderness.  Neurological: She is alert and oriented to person, place, and time.  Skin: Skin is warm and dry. She is not diaphoretic.  Psychiatric: She has a normal mood and affect. Her behavior is normal.  Nursing note and vitals reviewed.    ED Treatments / Results  Labs (all labs ordered are listed, but only abnormal results are displayed) Labs Reviewed  CBC WITH DIFFERENTIAL/PLATELET - Abnormal; Notable for the following:       Result Value   RDW 17.6 (*)    All other components within normal limits  BASIC METABOLIC PANEL - Abnormal; Notable for the following:    Chloride 97 (*)    BUN 29 (*)    GFR calc non Af Amer 59 (*)    All other components within normal limits  URINALYSIS, ROUTINE W REFLEX MICROSCOPIC - Abnormal; Notable  for the following:    Hgb urine dipstick SMALL (*)    Leukocytes, UA MODERATE (*)    Bacteria, UA RARE (*)    Squamous Epithelial / LPF 0-5 (*)    All other components within normal limits  PROTIME-INR - Abnormal; Notable for the following:    Prothrombin Time 18.4 (*)    All other components within normal limits  I-STAT CHEM 8, ED - Abnormal; Notable for the following:    Sodium 133 (*)    Chloride 94 (*)    BUN 29 (*)    Calcium, Ion 1.12 (*)    Hemoglobin 15.6 (*)    All other components within normal limits  URINE CULTURE  I-STAT TROPONIN, ED    EKG  EKG Interpretation  Date/Time:  Thursday August 03 2017 09:23:43 EDT Ventricular Rate:  99 PR Interval:    QRS Duration: 84 QT Interval:  296 QTC Calculation: 380 R Axis:   -5 Text Interpretation:  Sinus rhythm Low voltage, extremity leads Nonspecific T abnormalities, lateral leads st depression in the anterior leads new from prior Confirmed by Melene Plan [id number]) on 08/03/2017 9:38:41 AM       Radiology Dg Chest 2  View  Result Date: 08/03/2017 CLINICAL DATA:  Tachycardia. EXAM: CHEST  2 VIEW COMPARISON:  Radiograph of April 10, 2008. FINDINGS: Stable cardiomediastinal silhouette. Atherosclerosis of thoracic aorta is noted. No pneumothorax is noted. Minimal bibasilar subsegmental atelectasis is noted. Small bilateral pleural effusions are noted. Bony thorax is unremarkable. IMPRESSION: Aortic atherosclerosis. Minimal bibasilar subsegmental atelectasis. Small bilateral pleural effusions. Electronically Signed   By: Lupita Raider, M.D.   On: 08/03/2017 09:50    Procedures Procedures (including critical care time) Procedure note: Ultrasound Guided Peripheral IV Ultrasound guided peripheral 1.88 inch angiocath IV placement performed by me. Indications: Nursing unable to place IV. Details: The antecubital fossa and upper arm were evaluated with a multifrequency linear probe. Patent brachial veins were noted. 1 attempt was made to cannulate a vein under realtime US guidance with successful cannulation of the vein and catheter placement. There is return of non-pulsatile dark red blood. The patient tolerated the procedure well without complications. Images archived electronically.  CPT codes: 82641 and [phone]  Medications Ordered in ED Medications  iopamidol (ISOVUE-370) 76 % injection (not administered)  iopamidol (ISOVUE-370) 76 % injection (not administered)  sodium chloride 0.9 % bolus 500 mL (500 mLs Intravenous New Bag/Given 08/03/17 1017)  iopamidol (ISOVUE-370) 76 % injection 100 mL (100 mLs Intravenous Contrast Given 08/03/17 1340)     Initial Impression / Assessment and Plan / ED Course  I have reviewed the triage vital signs and the nursing notes.  Pertinent labs & imaging results that were available during my care of the patient were reviewed by me and considered in my medical decision making (see chart for details).     83 yo F with a chief complaint of leukocytosis and hypoxia. The patient is not  hypoxic here there was noted to be mildly tachycardic.   Im unsure of the utility of workup here as the patient is palliative care currently.  Will obtain repeat labs, cxr, ua.  I discussed the case with the patient's niece, Albertine Grates.  After some discussion the family felt that fully evaluating for a PE was the correct course of action. If the patient was more of a concern for an atypical ACS presentation she did not feel that cardiac cath was a reasonable option for her.history the patient  did have a PE with EKG changes she felt that the patient would want to be placed in the hospital. She did not feel that thrombolytic therapy or localized thrombolysis would be a good option. If negative for PE, ok with outpatient follow up.   Difficult IV stick. Initially was failed by the nursing staff. Placed by IV team but infiltrated in CT. I placed a IV with ultrasound guidance. Awaiting repeat scan.  Patient turned over to Dr. Eudelia Bunch, please see his note for further care.   The patients results and plan were reviewed and discussed.   Any x-rays performed were independently reviewed by myself.   Differential diagnosis were considered with the presenting HPI.  Medications  iopamidol (ISOVUE-370) 76 % injection (not administered)  iopamidol (ISOVUE-370) 76 % injection (not administered)  sodium chloride 0.9 % bolus 500 mL (500 mLs Intravenous New Bag/Given 08/03/17 1017)  iopamidol (ISOVUE-370) 76 % injection 100 mL (100 mLs Intravenous Contrast Given 08/03/17 1340)    Vitals:   08/03/17 0926 08/03/17 1048 08/03/17 1618  BP: (!) 91/59 98/65 95/75   Pulse: (!) 104 94 (!) 106  Resp: 17 17 (!) 21  Temp: 98.5 F (36.9 C)  (!) 97.5 F (36.4 C)  TempSrc: Oral  Oral  SpO2: 99% 99% 94%    Final diagnoses:  Weakness      Final Clinical Impressions(s) / ED Diagnoses   Final diagnoses:  Weakness    New Prescriptions New Prescriptions   No medications on file     Melene Plan, DO 08/03/17  1652

## 2017-08-05 LAB — URINE CULTURE

## 2017-08-06 ENCOUNTER — Telehealth: Payer: Self-pay

## 2017-08-06 NOTE — Telephone Encounter (Signed)
No treatment needed for [facility] ED 08/03/17 Per Harolyn Rutherford PA

## 2017-08-06 NOTE — Progress Notes (Signed)
ED Antimicrobial Stewardship Positive Culture Follow Up   [name] is an 83 y.o. female who presented to [facility] on 08/03/2017 with a chief complaint of  Chief Complaint  Patient presents with  . Abnormal Lab    Recent Results (from the past 720 hour(s))  Urine culture     Status: Abnormal   Collection Time: 08/03/17 11:36 AM  Result Value Ref Range Status   Specimen Description URINE, CLEAN CATCH  Final   Special Requests NONE  Final   Culture >=100,000 COLONIES/mL ESCHERICHIA COLI (A)  Final   Report Status 08/05/2017 FINAL  Final   Organism ID, Bacteria ESCHERICHIA COLI (A)  Final      Susceptibility   Escherichia coli - MIC*    AMPICILLIN >=32 RESISTANT Resistant     CEFAZOLIN 32 INTERMEDIATE Intermediate     CEFTRIAXONE <=1 SENSITIVE Sensitive     CIPROFLOXACIN >=4 RESISTANT Resistant     GENTAMICIN <=1 SENSITIVE Sensitive     IMIPENEM <=0.25 SENSITIVE Sensitive     NITROFURANTOIN <=16 SENSITIVE Sensitive     TRIMETH/SULFA <=20 SENSITIVE Sensitive     AMPICILLIN/SULBACTAM >=32 RESISTANT Resistant     PIP/TAZO 16 SENSITIVE Sensitive     Extended ESBL NEGATIVE Sensitive     * >=100,000 COLONIES/mL ESCHERICHIA COLI    No urinary symptoms. No further treatment necessary.  ED Provider: Harolyn Rutherford, PA   Babs Bertin, PharmD, BCPS Clinical Pharmacist 08/06/2017 9:58 AM

## 2017-08-20 ENCOUNTER — Inpatient Hospital Stay (HOSPITAL_COMMUNITY)
Admission: EM | Admit: 2017-08-20 | Discharge: 2017-08-24 | DRG: 871 | Disposition: A | Discharge status: Skilled Nursing Facility | Payer: Medicare Other | Attending: Internal Medicine | Admitting: Internal Medicine

## 2017-08-20 ENCOUNTER — Emergency Department (HOSPITAL_COMMUNITY): Payer: Medicare Other

## 2017-08-20 ENCOUNTER — Encounter (HOSPITAL_COMMUNITY): Payer: Self-pay | Admitting: Emergency Medicine

## 2017-08-20 DIAGNOSIS — Z515 Encounter for palliative care: Secondary | ICD-10-CM

## 2017-08-20 DIAGNOSIS — I739 Peripheral vascular disease, unspecified: Secondary | ICD-10-CM | POA: Diagnosis present

## 2017-08-20 DIAGNOSIS — I82409 Acute embolism and thrombosis of unspecified deep veins of unspecified lower extremity: Secondary | ICD-10-CM | POA: Diagnosis present

## 2017-08-20 DIAGNOSIS — N390 Urinary tract infection, site not specified: Secondary | ICD-10-CM | POA: Diagnosis present

## 2017-08-20 DIAGNOSIS — E872 Acidosis: Secondary | ICD-10-CM | POA: Diagnosis present

## 2017-08-20 DIAGNOSIS — Z7189 Other specified counseling: Secondary | ICD-10-CM

## 2017-08-20 DIAGNOSIS — Z888 Allergy status to other drugs, medicaments and biological substances status: Secondary | ICD-10-CM

## 2017-08-20 DIAGNOSIS — A419 Sepsis, unspecified organism: Secondary | ICD-10-CM

## 2017-08-20 DIAGNOSIS — R34 Anuria and oliguria: Secondary | ICD-10-CM | POA: Diagnosis not present

## 2017-08-20 DIAGNOSIS — A4151 Sepsis due to Escherichia coli [E. coli]: Principal | ICD-10-CM | POA: Diagnosis present

## 2017-08-20 DIAGNOSIS — Z87891 Personal history of nicotine dependence: Secondary | ICD-10-CM

## 2017-08-20 DIAGNOSIS — I959 Hypotension, unspecified: Secondary | ICD-10-CM | POA: Diagnosis present

## 2017-08-20 DIAGNOSIS — K5090 Crohn's disease, unspecified, without complications: Secondary | ICD-10-CM | POA: Diagnosis present

## 2017-08-20 DIAGNOSIS — Z86718 Personal history of other venous thrombosis and embolism: Secondary | ICD-10-CM

## 2017-08-20 DIAGNOSIS — F0390 Unspecified dementia without behavioral disturbance: Secondary | ICD-10-CM | POA: Diagnosis present

## 2017-08-20 DIAGNOSIS — Z66 Do not resuscitate: Secondary | ICD-10-CM | POA: Diagnosis present

## 2017-08-20 DIAGNOSIS — H9190 Unspecified hearing loss, unspecified ear: Secondary | ICD-10-CM | POA: Diagnosis present

## 2017-08-20 DIAGNOSIS — Z7901 Long term (current) use of anticoagulants: Secondary | ICD-10-CM

## 2017-08-20 DIAGNOSIS — I10 Essential (primary) hypertension: Secondary | ICD-10-CM | POA: Diagnosis present

## 2017-08-20 DIAGNOSIS — Z993 Dependence on wheelchair: Secondary | ICD-10-CM

## 2017-08-20 DIAGNOSIS — I872 Venous insufficiency (chronic) (peripheral): Secondary | ICD-10-CM | POA: Diagnosis present

## 2017-08-20 DIAGNOSIS — Z79899 Other long term (current) drug therapy: Secondary | ICD-10-CM

## 2017-08-20 DIAGNOSIS — G9341 Metabolic encephalopathy: Secondary | ICD-10-CM | POA: Diagnosis present

## 2017-08-20 DIAGNOSIS — Z79891 Long term (current) use of opiate analgesic: Secondary | ICD-10-CM

## 2017-08-20 DIAGNOSIS — H578 Other specified disorders of eye and adnexa: Secondary | ICD-10-CM | POA: Diagnosis present

## 2017-08-20 DIAGNOSIS — I509 Heart failure, unspecified: Secondary | ICD-10-CM

## 2017-08-20 DIAGNOSIS — I110 Hypertensive heart disease with heart failure: Secondary | ICD-10-CM | POA: Diagnosis present

## 2017-08-20 DIAGNOSIS — R6521 Severe sepsis with septic shock: Secondary | ICD-10-CM | POA: Diagnosis present

## 2017-08-20 LAB — CBC WITH DIFFERENTIAL/PLATELET
BASOS ABS: 0 10*3/uL (ref 0.0–0.1)
BASOS PCT: 0 %
Eosinophils Absolute: 0 10*3/uL (ref 0.0–0.7)
Eosinophils Relative: 0 %
HEMATOCRIT: 36 % (ref 36.0–46.0)
HEMOGLOBIN: 12.2 g/dL (ref 12.0–15.0)
LYMPHS PCT: 10 %
Lymphs Abs: 1.8 10*3/uL (ref 0.7–4.0)
MCH: 33.2 pg (ref 26.0–34.0)
MCHC: 33.9 g/dL (ref 30.0–36.0)
MCV: 97.8 fL (ref 78.0–100.0)
MONO ABS: 0.8 10*3/uL (ref 0.1–1.0)
Monocytes Relative: 4 %
NEUTROS ABS: 15.8 10*3/uL — AB (ref 1.7–7.7)
NEUTROS PCT: 86 %
Platelets: 306 10*3/uL (ref 150–400)
RBC: 3.68 MIL/uL — AB (ref 3.87–5.11)
RDW: 16.9 % — AB (ref 11.5–15.5)
WBC: 18.4 10*3/uL — ABNORMAL HIGH (ref 4.0–10.5)

## 2017-08-20 LAB — I-STAT CG4 LACTIC ACID, ED: Lactic Acid, Venous: 3.04 mmol/L (ref 0.5–1.9)

## 2017-08-20 LAB — URINALYSIS, ROUTINE W REFLEX MICROSCOPIC
BILIRUBIN URINE: NEGATIVE
GLUCOSE, UA: NEGATIVE mg/dL
HGB URINE DIPSTICK: NEGATIVE
KETONES UR: NEGATIVE mg/dL
NITRITE: NEGATIVE
PH: 5.5 (ref 5.0–8.0)
PROTEIN: NEGATIVE mg/dL
Specific Gravity, Urine: 1.015 (ref 1.005–1.030)
Squamous Epithelial / LPF: NONE SEEN

## 2017-08-20 LAB — BLOOD GAS, ARTERIAL
Acid-base deficit: 0.2 mmol/L (ref 0.0–2.0)
BICARBONATE: 22.7 mmol/L (ref 20.0–28.0)
Drawn by: 257881
O2 Content: 2 L/min
O2 SAT: 97.5 %
PATIENT TEMPERATURE: 98.6
PCO2 ART: 32.5 mmHg (ref 32.0–48.0)
PO2 ART: 99 mmHg (ref 83.0–108.0)
pH, Arterial: 7.457 — ABNORMAL HIGH (ref 7.350–7.450)

## 2017-08-20 LAB — PROTIME-INR
INR: 2.52
Prothrombin Time: 27.7 seconds — ABNORMAL HIGH (ref 11.4–15.2)

## 2017-08-20 MED ORDER — ONDANSETRON HCL 4 MG/2ML IJ SOLN
4.0000 mg | Freq: Once | INTRAMUSCULAR | Status: DC
  Filled 2017-08-20: qty 2

## 2017-08-20 MED ORDER — VANCOMYCIN HCL IN DEXTROSE 1-5 GM/200ML-% IV SOLN
1000.0000 mg | INTRAVENOUS | Status: DC
Start: 2017-08-21 — End: 2017-08-21

## 2017-08-20 MED ORDER — SODIUM CHLORIDE 0.9 % IV BOLUS (SEPSIS)
1000.0000 mL | Freq: Once | INTRAVENOUS | Status: AC
  Administered 2017-08-20: 1000 mL via INTRAVENOUS

## 2017-08-20 MED ORDER — VANCOMYCIN HCL IN DEXTROSE 1-5 GM/200ML-% IV SOLN
1000.0000 mg | Freq: Once | INTRAVENOUS | Status: AC
  Administered 2017-08-20: 1000 mg via INTRAVENOUS
  Filled 2017-08-20: qty 200

## 2017-08-20 MED ORDER — PIPERACILLIN-TAZOBACTAM 3.375 G IVPB
3.3750 g | Freq: Three times a day (TID) | INTRAVENOUS | Status: DC
  Filled 2017-08-20: qty 50

## 2017-08-20 MED ORDER — PIPERACILLIN-TAZOBACTAM 3.375 G IVPB 30 MIN
3.3750 g | Freq: Once | INTRAVENOUS | Status: AC
  Administered 2017-08-20: 3.375 g via INTRAVENOUS
  Filled 2017-08-20: qty 50

## 2017-08-20 NOTE — ED Notes (Signed)
Purewick in place to obtain UA and to prevent wetness to brakdown

## 2017-08-20 NOTE — ED Provider Notes (Signed)
[facility]-EMERGENCY DEPT Provider Note   CSN: [id number] Arrival date & time: 08/20/17  1734     History   Chief Complaint Chief Complaint  Patient presents with  . hypotensive  . possible sepsis    HPI [name] is a 83 y.o. female.chief complaint is low blood pressure, weakness.  HPI  83 year old female from a skilled nursing facility. History of frequent sepsis, UTIs, dementia, peripheral vascular disease, osteoporosis, previous intestinal vesicular fistula, Crohn's disease, C. Difficile, hypertension, anemia.  Presents here after weakness for the last 2 days and reported temperature 100.9 from skilled nursing facility. Medics arrived and were given a report of a blood pressure of 50. Transferred here. Unable to establish IV en route.  She has dementia. Unable to offer assistance with her history  Past Medical History:  Diagnosis Date  . Acute venous embolism and thrombosis of deep vessels of distal lower extremity (HCC)   . Allergic rhinitis, cause unspecified   . Anemia, unspecified   . Crohn's disease (HCC)   . Diarrhea   . Esophageal reflux   . Hypertension   . Infection with microorganisms resistant to other specified drugs without mention of resistance to multiple drugs   . Intestinal infection due to Clostridium difficile   . Intestinovesical fistula   . Oliguria and anuria   . Osteoarthrosis, unspecified whether generalized or localized, unspecified site   . Osteoporosis, unspecified   . Peripheral vascular disease, unspecified (HCC)   . Pneumonia, organism unspecified(486)   . Senile dementia, uncomplicated   . Sepsis(995.91)   . Unspecified constipation   . Unspecified venous (peripheral) insufficiency   . Urinary tract infection, site not specified     Patient Active Problem List   Diagnosis Date Noted  . Unspecified venous (peripheral) insufficiency 04/23/2013  . CONGESTIVE HEART FAILURE 09/18/2007  . HYPERTENSION 07/30/2007  . OSTEOARTHRITIS  07/30/2007    Past Surgical History:  Procedure Laterality Date  . EYE SURGERY     Cataract removal    OB History    No data available       Home Medications    Prior to Admission medications   Medication Sig Start Date End Date Taking? Authorizing Provider  acetaminophen (TYLENOL) 325 MG tablet Take 650 mg by mouth 2 (two) times daily.   Yes [provider]  alum & mag hydroxide-simeth (MAALOX/MYLANTA) 200-200-20 MG/5ML suspension Take 20 mLs by mouth every 8 (eight) hours as needed for indigestion or heartburn.   Yes [provider]  furosemide (LASIX) 80 MG tablet Take 80 mg by mouth daily.   Yes [provider]  gabapentin (NEURONTIN) 300 MG capsule Take 300 mg by mouth at bedtime.   Yes [provider]  Glucosamine Sulfate 1000 MG CAPS Take 1 capsule by mouth 2 (two) times daily.   Yes [provider]  HYDROcodone-acetaminophen (NORCO/VICODIN) 5-325 MG per tablet Take 1 tablet by mouth every morning.   Yes [provider]  ipratropium-albuterol (DUONEB) 0.5-2.5 (3) MG/3ML SOLN Take 3 mLs by nebulization every 12 (twelve) hours.   Yes [provider]  Multiple Vitamin (MULTIVITAMIN WITH MINERALS) TABS tablet Take 1 tablet by mouth daily with breakfast.   Yes [provider]  Nutritional Supplements (NUTRITIONAL DRINK) LIQD Take 1 Can by mouth 3 (three) times daily. *Med Pass*   Yes [provider]  potassium chloride (K-DUR,KLOR-CON) 10 MEQ tablet Take 20 mEq by mouth 2 (two) times daily.    Yes [provider]  rivaroxaban (  XARELTO) 20 MG TABS tablet Take 20 mg by mouth daily.    Yes [provider]  Vitamin D, Ergocalciferol, (DRISDOL) 50000 UNITS CAPS Take 50,000 Units by mouth every 30 (thirty) days. Takes on the 15th of every month   Yes [provider]  omeprazole (PRILOSEC) 20 MG capsule Take 20 mg by mouth 2 (two) times daily.     [provider]    Family  History No family history on file.  Social History Social History  Substance Use Topics  . Smoking status: Former Games developer  . Smokeless tobacco: Never Used  . Alcohol use No     Allergies   Hydrochlorothiazide   Review of Systems Review of Systems  Unable to perform ROS: Dementia     Physical Exam Updated Vital Signs BP (!) 67/52   Pulse (!) 103   Temp 98.7 F (37.1 C) (Rectal)   Resp 18   Ht 5' (1.524 m)   Wt 65.8 kg (145 lb)   SpO2 97%   BMI 28.32 kg/m   Physical Exam  Constitutional: No distress.  Patient blood pressure between 50 and 70. Awake and alert. No altered level of conscious. Demented.  HENT:  Mucous membranes dry  Eyes:  Conjunctiva not pale.  Neck:  No JVD.  Cardiovascular:  Sinus tach 110. BP 65 systolic.  Pulmonary/Chest:  Clear lungs. Not tachypneic. Sats 96%.  Abdominal:  Soft benign abdomen. No masses or pulsations.  Musculoskeletal: Normal range of motion.  Neurological: She is alert.  Skin: She is not diaphoretic.  Cyanotic fingers and toes. This does improve with hydration.     ED Treatments / Results  Labs (all labs ordered are listed, but only abnormal results are displayed) Labs Reviewed  CBC WITH DIFFERENTIAL/PLATELET - Abnormal; Notable for the following:       Result Value   WBC 18.4 (*)    RBC 3.68 (*)    RDW 16.9 (*)    Neutro Abs 15.8 (*)    All other components within normal limits  PROTIME-INR - Abnormal; Notable for the following:    Prothrombin Time 27.7 (*)    All other components within normal limits  URINALYSIS, ROUTINE W REFLEX MICROSCOPIC - Abnormal; Notable for the following:    Leukocytes, UA TRACE (*)    Bacteria, UA MANY (*)    All other components within normal limits  BLOOD GAS, ARTERIAL - Abnormal; Notable for the following:    pH, Arterial 7.457 (*)    All other components within normal limits  I-STAT CG4 LACTIC ACID, ED - Abnormal; Notable for the following:    Lactic Acid, Venous 3.04 (*)      All other components within normal limits  CULTURE, BLOOD (ROUTINE X 2)  CULTURE, BLOOD (ROUTINE X 2)  URINE CULTURE  COMPREHENSIVE METABOLIC PANEL  LACTIC ACID, PLASMA  I-STAT TROPONIN, ED  I-STAT CG4 LACTIC ACID, ED    EKG  EKG Interpretation None       Radiology Dg Chest 2 View  Result Date: 08/20/2017 CLINICAL DATA:  Shortness of breath and hypotension EXAM: CHEST  2 VIEW COMPARISON:  CT 08/03/2017, radiograph 08/03/2017 FINDINGS: Bilateral pleural effusions, left greater than right with atelectasis or infiltrate at the left lung base. Possible loculated appearance of fluid posteriorly on the lateral image. Stable cardiomediastinal silhouette with atherosclerosis. No pneumothorax. IMPRESSION: Bilateral pleural effusions, left greater than right similar compared to prior with adjacent left basilar atelectasis or infiltrate. Possible loculation of pleural fluid  posteriorly on the lateral view. Electronically Signed   By: Jasmine Pang M.D.   On: 08/20/2017 18:34    Procedures Procedures (including critical care time)  Medications Ordered in ED Medications  piperacillin-tazobactam (ZOSYN) IVPB 3.375 g (not administered)  vancomycin (VANCOCIN) IVPB 1000 mg/200 mL premix (not administered)  ondansetron (ZOFRAN) injection 4 mg (0 mg Intravenous Hold 08/20/17 2145)  sodium chloride 0.9 % bolus 1,000 mL (0 mLs Intravenous Stopped 08/20/17 1958)  sodium chloride 0.9 % bolus 1,000 mL (0 mLs Intravenous Stopped 08/20/17 2150)  piperacillin-tazobactam (ZOSYN) IVPB 3.375 g (0 g Intravenous Stopped 08/20/17 2109)  vancomycin (VANCOCIN) IVPB 1000 mg/200 mL premix (0 mg Intravenous Stopped 08/20/17 2105)     Initial Impression / Assessment and Plan / ED Course  I have reviewed the triage vital signs and the nursing notes.  Pertinent labs & imaging results that were available during my care of the patient were reviewed by me and considered in my medical decision making (see chart for  details).   elevated lactate. rectal temperature normal. Not hypothermic or febrile. Ultimately given 4 L of fluid through her ED stay and her blood pressures minimally to non-improved. Essentially normal. Leukocytosis 18,000. ABGs 7.45/32/99/22.  I contacted and spoke at length with the patient's power of attorney Scott [phone]). I discussed with him that she was hypotensive. Possibly septic. A not improving. She has been given fluids and antibiotics. I had a discussion with him that was quite frank and requesting assistance with out what the extent of care they would desire for her. She is DO NOT RESUSCITATE. Scott placed a call to his to nearest relatives, Olegario Messier, in and he can return a call to me. He states they were all in agreement that they would not want aggressive measures. They developed CPR. They do not want ventilator. They do not want central line. They do not want pressors. They're in agreement with fluids and antibiotics and otherwise palliative care.  I did request CT abdomen and chest as patient had recent DVT and was placed on L Quist has known IVC filter. I will discuss with hospitalist regarding admission for care and palliative care consultation.  CRITICAL CARE Performed by: Rolland Porter JOSEPH   Total critical care time: 30 minutes  Critical care time was exclusive of separately billable procedures and treating other patients.  Critical care was necessary to treat or prevent imminent or life-threatening deterioration.  Critical care was time spent personally by me on the following activities: development of treatment plan with patient and/or surrogate as well as nursing, discussions with consultants, evaluation of patient's response to treatment, examination of patient, obtaining history from patient or surrogate, ordering and performing treatments and interventions, ordering and review of laboratory studies, ordering and review of radiographic studies, pulse oximetry and  re-evaluation of patient's condition.   Final Clinical Impressions(s) / ED Diagnoses   Final diagnoses:  Hypotension, unspecified hypotension type    New Prescriptions New Prescriptions   No medications on file     Rolland Porter, MD 08/20/17 2326

## 2017-08-20 NOTE — ED Notes (Signed)
EDP James aware of patient's hypotension

## 2017-08-20 NOTE — ED Notes (Signed)
Pt to xray. Second IV started with minimal blood draw- not enough for lab

## 2017-08-20 NOTE — Progress Notes (Signed)
Pharmacy Antibiotic Note  [name] is a 83 y.o. female admitted on 08/20/2017 from a nursing home with SOB and hypotension, suspected sepsis.  Pharmacy has been consulted for Vancomycin and Zosyn  Dosing. First doses given in ED. SCr was wnl on 08/03/17 but there is no new result at the time of this note.   Plan: Zosyn 3.375g IV Q8H infused over 4hrs. Vancomycin 1g IV q24h. Measure Vanc trough at steady state. Follow up renal fxn, culture results, and clinical course.  Height: 5' (152.4 cm) Weight: 145 lb (65.8 kg) IBW/kg (Calculated) : 45.5  Temp (24hrs), Avg:98.7 F (37.1 C), Min:98.7 F (37.1 C), Max:98.7 F (37.1 C)   Recent Labs Lab 08/20/17 1849  WBC 18.4*  LATICACIDVEN 3.04*    Estimated Creatinine Clearance: 32.3 mL/min (by C-G formula based on SCr of 1 mg/dL).    Allergies  Allergen Reactions  . Hydrochlorothiazide     Unknown reaction - No known allergies per MAR     Antimicrobials this admission: 8/26 Zosyn >> 8/26 Vanc >>   Dose adjustments this admission:   Microbiology results: BCx: UCx:  Sputum:  MRSA PCR:  Thank you for allowing pharmacy to be a part of this patient's care.  Charolotte Eke, PharmD, pager [phone]. 08/20/2017,9:20 PM.

## 2017-08-20 NOTE — ED Notes (Signed)
Please [name] the POA updated [phone]

## 2017-08-20 NOTE — ED Notes (Signed)
Blood draw attempt x 2 unsuccessful. RN and MD made aware.

## 2017-08-20 NOTE — ED Triage Notes (Signed)
Pt from [facility] with SOB and hypotension x 2 days. Pt was recently diagnosed with a thrombus in her leg and staff made her bed bound for this. Pt is alert and oriented per baseline but is hard of hearing. Pt is aware she is at [facility]. Pt has palpable radial pulses but increased cap refill time in left hand (greater than 5 seconds) Pt reports cough. Pt is able to maintain 100% oxygen sat on 2L [place]

## 2017-08-21 DIAGNOSIS — R34 Anuria and oliguria: Secondary | ICD-10-CM | POA: Diagnosis not present

## 2017-08-21 DIAGNOSIS — K5090 Crohn's disease, unspecified, without complications: Secondary | ICD-10-CM | POA: Diagnosis present

## 2017-08-21 DIAGNOSIS — Z86718 Personal history of other venous thrombosis and embolism: Secondary | ICD-10-CM | POA: Diagnosis not present

## 2017-08-21 DIAGNOSIS — I824Y9 Acute embolism and thrombosis of unspecified deep veins of unspecified proximal lower extremity: Secondary | ICD-10-CM | POA: Diagnosis not present

## 2017-08-21 DIAGNOSIS — Z888 Allergy status to other drugs, medicaments and biological substances status: Secondary | ICD-10-CM | POA: Diagnosis not present

## 2017-08-21 DIAGNOSIS — I9589 Other hypotension: Secondary | ICD-10-CM | POA: Diagnosis not present

## 2017-08-21 DIAGNOSIS — N390 Urinary tract infection, site not specified: Secondary | ICD-10-CM | POA: Diagnosis not present

## 2017-08-21 DIAGNOSIS — Z7901 Long term (current) use of anticoagulants: Secondary | ICD-10-CM | POA: Diagnosis not present

## 2017-08-21 DIAGNOSIS — Z7189 Other specified counseling: Secondary | ICD-10-CM | POA: Diagnosis not present

## 2017-08-21 DIAGNOSIS — E872 Acidosis: Secondary | ICD-10-CM | POA: Diagnosis present

## 2017-08-21 DIAGNOSIS — R6521 Severe sepsis with septic shock: Secondary | ICD-10-CM | POA: Diagnosis not present

## 2017-08-21 DIAGNOSIS — F0390 Unspecified dementia without behavioral disturbance: Secondary | ICD-10-CM | POA: Diagnosis present

## 2017-08-21 DIAGNOSIS — Z87891 Personal history of nicotine dependence: Secondary | ICD-10-CM | POA: Diagnosis not present

## 2017-08-21 DIAGNOSIS — I110 Hypertensive heart disease with heart failure: Secondary | ICD-10-CM | POA: Diagnosis present

## 2017-08-21 DIAGNOSIS — I872 Venous insufficiency (chronic) (peripheral): Secondary | ICD-10-CM | POA: Diagnosis present

## 2017-08-21 DIAGNOSIS — A4151 Sepsis due to Escherichia coli [E. coli]: Secondary | ICD-10-CM | POA: Diagnosis not present

## 2017-08-21 DIAGNOSIS — I959 Hypotension, unspecified: Secondary | ICD-10-CM | POA: Diagnosis present

## 2017-08-21 DIAGNOSIS — I82409 Acute embolism and thrombosis of unspecified deep veins of unspecified lower extremity: Secondary | ICD-10-CM | POA: Diagnosis present

## 2017-08-21 DIAGNOSIS — K50018 Crohn's disease of small intestine with other complication: Secondary | ICD-10-CM | POA: Diagnosis not present

## 2017-08-21 DIAGNOSIS — I509 Heart failure, unspecified: Secondary | ICD-10-CM | POA: Diagnosis present

## 2017-08-21 DIAGNOSIS — H578 Other specified disorders of eye and adnexa: Secondary | ICD-10-CM | POA: Diagnosis present

## 2017-08-21 DIAGNOSIS — Z993 Dependence on wheelchair: Secondary | ICD-10-CM | POA: Diagnosis not present

## 2017-08-21 DIAGNOSIS — Z515 Encounter for palliative care: Secondary | ICD-10-CM | POA: Diagnosis not present

## 2017-08-21 DIAGNOSIS — I739 Peripheral vascular disease, unspecified: Secondary | ICD-10-CM | POA: Diagnosis present

## 2017-08-21 DIAGNOSIS — H9190 Unspecified hearing loss, unspecified ear: Secondary | ICD-10-CM | POA: Diagnosis present

## 2017-08-21 DIAGNOSIS — Z66 Do not resuscitate: Secondary | ICD-10-CM | POA: Diagnosis present

## 2017-08-21 DIAGNOSIS — Z79891 Long term (current) use of opiate analgesic: Secondary | ICD-10-CM | POA: Diagnosis not present

## 2017-08-21 DIAGNOSIS — G9341 Metabolic encephalopathy: Secondary | ICD-10-CM | POA: Diagnosis not present

## 2017-08-21 DIAGNOSIS — Z79899 Other long term (current) drug therapy: Secondary | ICD-10-CM | POA: Diagnosis not present

## 2017-08-21 DIAGNOSIS — A419 Sepsis, unspecified organism: Secondary | ICD-10-CM | POA: Diagnosis not present

## 2017-08-21 LAB — LACTIC ACID, PLASMA
LACTIC ACID, VENOUS: 1.6 mmol/L (ref 0.5–1.9)
LACTIC ACID, VENOUS: 3.9 mmol/L — AB (ref 0.5–1.9)

## 2017-08-21 LAB — COMPREHENSIVE METABOLIC PANEL
ALBUMIN: 1.4 g/dL — AB (ref 3.5–5.0)
ALT: 38 U/L (ref 14–54)
AST: 41 U/L (ref 15–41)
Alkaline Phosphatase: 95 U/L (ref 38–126)
Anion gap: 9 (ref 5–15)
BUN: 22 mg/dL — AB (ref 6–20)
CHLORIDE: 102 mmol/L (ref 101–111)
CO2: 20 mmol/L — ABNORMAL LOW (ref 22–32)
Calcium: 8.7 mg/dL — ABNORMAL LOW (ref 8.9–10.3)
Creatinine, Ser: 0.97 mg/dL (ref 0.44–1.00)
GFR calc Af Amer: 58 mL/min — ABNORMAL LOW (ref >60)
GFR calc non Af Amer: 50 mL/min — ABNORMAL LOW (ref >60)
GLUCOSE: 96 mg/dL (ref 65–99)
POTASSIUM: 5.4 mmol/L — AB (ref 3.5–5.1)
SODIUM: 131 mmol/L — AB (ref 135–145)
Total Bilirubin: 0.9 mg/dL (ref 0.3–1.2)
Total Protein: 3.9 g/dL — ABNORMAL LOW (ref 6.5–8.1)

## 2017-08-21 LAB — BASIC METABOLIC PANEL
Anion gap: 13 (ref 5–15)
BUN: 25 mg/dL — ABNORMAL HIGH (ref 6–20)
CHLORIDE: 101 mmol/L (ref 101–111)
CO2: 21 mmol/L — AB (ref 22–32)
CREATININE: 1.1 mg/dL — AB (ref 0.44–1.00)
Calcium: 8.7 mg/dL — ABNORMAL LOW (ref 8.9–10.3)
GFR calc non Af Amer: 43 mL/min — ABNORMAL LOW (ref >60)
GFR, EST AFRICAN AMERICAN: 50 mL/min — AB (ref >60)
Glucose, Bld: 52 mg/dL — ABNORMAL LOW (ref 65–99)
POTASSIUM: 4.6 mmol/L (ref 3.5–5.1)
Sodium: 135 mmol/L (ref 135–145)

## 2017-08-21 LAB — CBC
HCT: 32.4 % — ABNORMAL LOW (ref 36.0–46.0)
Hemoglobin: 11 g/dL — ABNORMAL LOW (ref 12.0–15.0)
MCH: 33.7 pg (ref 26.0–34.0)
MCHC: 34 g/dL (ref 30.0–36.0)
MCV: 99.4 fL (ref 78.0–100.0)
Platelets: 269 10*3/uL (ref 150–400)
RBC: 3.26 MIL/uL — ABNORMAL LOW (ref 3.87–5.11)
RDW: 17.1 % — AB (ref 11.5–15.5)
WBC: 20.5 10*3/uL — AB (ref 4.0–10.5)

## 2017-08-21 MED ORDER — SODIUM CHLORIDE 0.9 % IV SOLN
INTRAVENOUS | Status: DC
  Administered 2017-08-21: 09:00:00 via INTRAVENOUS

## 2017-08-21 MED ORDER — SODIUM CHLORIDE 0.9 % IV BOLUS (SEPSIS)
500.0000 mL | Freq: Once | INTRAVENOUS | Status: AC
  Administered 2017-08-21: 500 mL via INTRAVENOUS

## 2017-08-21 MED ORDER — ENOXAPARIN SODIUM 80 MG/0.8ML ~~LOC~~ SOLN: 1.0000 mg/kg | SUBCUTANEOUS | Status: DC

## 2017-08-21 MED ORDER — ONDANSETRON HCL 4 MG PO TABS: 4.0000 mg | ORAL_TABLET | Freq: Four times a day (QID) | ORAL | Status: DC | PRN

## 2017-08-21 MED ORDER — VANCOMYCIN HCL IN DEXTROSE 750-5 MG/150ML-% IV SOLN
750.0000 mg | INTRAVENOUS | Status: DC
  Administered 2017-08-21: 750 mg via INTRAVENOUS
  Filled 2017-08-21 (×2): qty 150

## 2017-08-21 MED ORDER — ONDANSETRON HCL 4 MG/2ML IJ SOLN
4.0000 mg | Freq: Four times a day (QID) | INTRAMUSCULAR | Status: DC | PRN
Start: 2017-08-21 — End: 2017-08-24

## 2017-08-21 MED ORDER — ENOXAPARIN SODIUM 80 MG/0.8ML ~~LOC~~ SOLN
65.0000 mg | Freq: Two times a day (BID) | SUBCUTANEOUS | Status: DC
  Administered 2017-08-21: 65 mg via SUBCUTANEOUS
  Filled 2017-08-21: qty 0.65

## 2017-08-21 MED ORDER — ALBUTEROL SULFATE (2.5 MG/3ML) 0.083% IN NEBU: 2.5000 mg | INHALATION_SOLUTION | Freq: Three times a day (TID) | RESPIRATORY_TRACT | Status: DC | PRN

## 2017-08-21 MED ORDER — SODIUM CHLORIDE 0.9 % IV SOLN
1.0000 g | Freq: Three times a day (TID) | INTRAVENOUS | Status: DC
  Filled 2017-08-21: qty 1

## 2017-08-21 MED ORDER — ENOXAPARIN SODIUM 80 MG/0.8ML ~~LOC~~ SOLN
1.0000 mg/kg | Freq: Two times a day (BID) | SUBCUTANEOUS | Status: DC
  Administered 2017-08-21: 65 mg via SUBCUTANEOUS

## 2017-08-21 MED ORDER — DEXTROSE-NACL 5-0.9 % IV SOLN
INTRAVENOUS | Status: DC
  Administered 2017-08-21 – 2017-08-23 (×3): via INTRAVENOUS

## 2017-08-21 MED ORDER — MEROPENEM 1 G IV SOLR
1.0000 g | Freq: Two times a day (BID) | INTRAVENOUS | Status: DC
  Administered 2017-08-21 – 2017-08-23 (×5): 1 g via INTRAVENOUS
  Filled 2017-08-21 (×6): qty 1

## 2017-08-21 MED ORDER — ERYTHROMYCIN 5 MG/GM OP OINT
TOPICAL_OINTMENT | Freq: Four times a day (QID) | OPHTHALMIC | Status: DC
  Administered 2017-08-21 – 2017-08-22 (×3): via OPHTHALMIC
  Administered 2017-08-22: 1 via OPHTHALMIC
  Administered 2017-08-22: 11:00:00 via OPHTHALMIC
  Administered 2017-08-22: 1 via OPHTHALMIC
  Administered 2017-08-23 (×2): via OPHTHALMIC
  Administered 2017-08-23 (×2): 1 via OPHTHALMIC
  Administered 2017-08-24: 06:00:00 via OPHTHALMIC
  Filled 2017-08-21 (×2): qty 3.5
  Filled 2017-08-21: qty 4

## 2017-08-21 NOTE — Progress Notes (Signed)
BSE completed, full report to follow.  Pt only allowed HOB elevated minimally - to 45*.  Mild oral transiting issues/delayed and no indication of pharyngeal deficits.  Recommend dys3/thin diet with precautions.  Left amplifier in room for pt  - advised nurse/nurse tech to set on 4 when in use - off when not.  Asked for amplifier to be returned to rehab or left at desk upon pt receiving hearing aids or leaving.     Donavan Burnet, MS [facility] SLP [phone]

## 2017-08-21 NOTE — ED Notes (Signed)
Paged and talked to hospitalist regarding the unstable hemodynamics readings in relation to the level of care bed request.

## 2017-08-21 NOTE — ED Notes (Signed)
Unsuccessful attempt to draw labs, phlebotomist to retry.

## 2017-08-21 NOTE — Progress Notes (Signed)
PHARMACY NOTE:  ANTIMICROBIAL RENAL DOSAGE ADJUSTMENT  Current antimicrobial regimen includes a mismatch between antimicrobial dosage and estimated renal function.  As per policy approved by the Pharmacy & Therapeutics and Medical Executive Committees, the antimicrobial dosage will be adjusted accordingly.  Current antimicrobial dosage:  Meropenem 1 Gm IV q8h  Indication: Sepsis  Renal Function: 33 ml/min  Estimated Creatinine Clearance: 33.3 mL/min (by C-G formula based on SCr of 0.97 mg/dL). []      On intermittent HD, scheduled: []      On CRRT    Antimicrobial dosage has been changed to:  Meropenem 1 Gm IV q12h     Thank you for allowing pharmacy to be a part of this patient's care.  Lorenza Evangelist, [facility] 08/21/2017 5:57 AM

## 2017-08-21 NOTE — Progress Notes (Signed)
ANTICOAGULATION CONSULT NOTE - Initial Consult  Pharmacy Consult for Lovenox Indication: DVT  Allergies  Allergen Reactions  . Hydrochlorothiazide     Unknown reaction - No known allergies per [facility]     Patient Measurements: Height: 5' (152.4 cm) Weight: 145 lb (65.8 kg) IBW/kg (Calculated) : 45.5   Vital Signs: Temp: 98.7 F (37.1 C) (08/26 1815) Temp Source: Rectal (08/26 1815) BP: 88/71 (08/27 0100) Pulse Rate: 98 (08/26 2315)  Labs:  Recent Labs  08/20/17 1849  HGB 12.2  HCT 36.0  PLT 306  LABPROT 27.7*  INR 2.52    Estimated Creatinine Clearance: 32.3 mL/min (by C-G formula based on SCr of 1 mg/dL).   Medical History: Past Medical History:  Diagnosis Date  . Acute venous embolism and thrombosis of deep vessels of distal lower extremity (HCC)   . Allergic rhinitis, cause unspecified   . Anemia, unspecified   . Crohn's disease (HCC)   . Diarrhea   . Esophageal reflux   . Hypertension   . Infection with microorganisms resistant to other specified drugs without mention of resistance to multiple drugs   . Intestinal infection due to Clostridium difficile   . Intestinovesical fistula   . Oliguria and anuria   . Osteoarthrosis, unspecified whether generalized or localized, unspecified site   . Osteoporosis, unspecified   . Peripheral vascular disease, unspecified (HCC)   . Pneumonia, organism unspecified(486)   . Senile dementia, uncomplicated   . Sepsis(995.91)   . Unspecified constipation   . Unspecified venous (peripheral) insufficiency   . Urinary tract infection, site not specified     Medications:  Scheduled:  . ondansetron (ZOFRAN) IV  4 mg Intravenous Once   Infusions:  . piperacillin-tazobactam (ZOSYN)  IV    . vancomycin      Assessment: 39 yoF admitted with SOB and hypotension on Xarelto PTA for recent DVT.  MD changing to Lovenox per Rx.  LD xarelto 8/26 at 0800. Goal of Therapy:  Heparin level 0.3-0.7 units/ml Anti-Xa level 0.6-1  units/ml 4hrs after LMWH dose given    Plan:  Lovenox 65 mg sq q12h (CrCl >30 ml/min) F/u scr adjust if needed  Susanne Greenhouse R 08/21/2017,1:37 AM

## 2017-08-21 NOTE — ED Notes (Signed)
Main phlebotomy paged to draw blood

## 2017-08-21 NOTE — H&P (Signed)
History and Physical    DELLAMAE ROSAMILIA NGE:[id number] DOB: 1926-05-13 DOA: 08/20/2017  PCP: Patient, No Pcp Per   Patient coming from: [facility].  Chief Complaint: Hypotension.  HPI: [name] is a 83 y.o. female with medical history significant for hypertension, congestive heart failure, recent DVT, Crohn's disease, history of intestinal vesical fistula, frequent UTIs, PVD, dementia. Patient is from [facility] nursing home. History is obtained from chart review, and talked to patient's great grandnephew- Who is Pts HCPOA. Patient is unable to give me history, appears lethargic, and confused. Patient never married and does not have any children.   Patient was brought to the ED today with complaints of weakness, SOB for the last 2 days and reported temperature 100.9. On EMS arrival blood pressure on the scene was systolic -50s.   ED Course: markedly low blood pressure 36/19, o2 sats 98% on nasal cannula heart rate 117 respiratory rate 12- 30. ABG- pH 7.45, PO2 99, PCO2 32, I-STAT lactic acid elevated at 3 > 3.9, WBC elevated at 18.4 , hemoglobin at 12, INR 2.5. UA- any bacteria, chest x-ray showed bilateral pleural effusions left greater than right compared to prior with adjustments and left basilar atelectasis or infiltrates, possible occlusion of pleural fluid. Subsequent CT chest - small to moderate pleural effusions. CT abdomen and pelvis without contrast- slightly thickened appearing distal small bowel partially contained retained ventral hernia findings could relate to infectious, inflammatory or ischemic bowel disease. Patient was given total of 4L IVF bouls in ED, started on IV vancomycin and Zosyn in the ED. ED providers talked extensively with pts HCPOA- [name]- talked to other family member Olegario Messier, who is also HCPOA, they do not want central lines, patient is DO NOT RESUSCITATE.  Review of Systems:  Could not be reviewed due to patient's mental status  Past Medical History:    Diagnosis Date  . Acute venous embolism and thrombosis of deep vessels of distal lower extremity (HCC)   . Allergic rhinitis, cause unspecified   . Anemia, unspecified   . Crohn's disease (HCC)   . Diarrhea   . Esophageal reflux   . Hypertension   . Infection with microorganisms resistant to other specified drugs without mention of resistance to multiple drugs   . Intestinal infection due to Clostridium difficile   . Intestinovesical fistula   . Oliguria and anuria   . Osteoarthrosis, unspecified whether generalized or localized, unspecified site   . Osteoporosis, unspecified   . Peripheral vascular disease, unspecified (HCC)   . Pneumonia, organism unspecified(486)   . Senile dementia, uncomplicated   . Sepsis(995.91)   . Unspecified constipation   . Unspecified venous (peripheral) insufficiency   . Urinary tract infection, site not specified     Past Surgical History:  Procedure Laterality Date  . EYE SURGERY     Cataract removal     reports that she has quit smoking. She has never used smokeless tobacco. She reports that she does not drink alcohol or use drugs.  Allergies  Allergen Reactions  . Hydrochlorothiazide     Unknown reaction - No known allergies per MAR     No family history on file. Acceptable: Family history reviewed and not pertinent (If you reviewed it)  Prior to Admission medications   Medication Sig Start Date End Date Taking? Authorizing Provider  acetaminophen (TYLENOL) 325 MG tablet Take 650 mg by mouth 2 (two) times daily.   Yes [provider]  alum & mag hydroxide-simeth (MAALOX/MYLANTA) 200-200-20  MG/5ML suspension Take 20 mLs by mouth every 8 (eight) hours as needed for indigestion or heartburn.   Yes [provider]  furosemide (LASIX) 80 MG tablet Take 80 mg by mouth daily.   Yes [provider]  gabapentin (NEURONTIN) 300 MG capsule Take 300 mg by mouth at bedtime.   Yes [provider]  Glucosamine  Sulfate 1000 MG CAPS Take 1 capsule by mouth 2 (two) times daily.   Yes [provider]  HYDROcodone-acetaminophen (NORCO/VICODIN) 5-325 MG per tablet Take 1 tablet by mouth every morning.   Yes [provider]  ipratropium-albuterol (DUONEB) 0.5-2.5 (3) MG/3ML SOLN Take 3 mLs by nebulization every 12 (twelve) hours.   Yes [provider]  Multiple Vitamin (MULTIVITAMIN WITH MINERALS) TABS tablet Take 1 tablet by mouth daily with breakfast.   Yes [provider]  Nutritional Supplements (NUTRITIONAL DRINK) LIQD Take 1 Can by mouth 3 (three) times daily. *Med Pass*   Yes [provider]  potassium chloride (K-DUR,KLOR-CON) 10 MEQ tablet Take 20 mEq by mouth 2 (two) times daily.    Yes [provider]  rivaroxaban (XARELTO) 20 MG TABS tablet Take 20 mg by mouth daily.    Yes [provider]  Vitamin D, Ergocalciferol, (DRISDOL) 50000 UNITS CAPS Take 50,000 Units by mouth every 30 (thirty) days. Takes on the 15th of every month   Yes [provider]  omeprazole (PRILOSEC) 20 MG capsule Take 20 mg by mouth 2 (two) times daily.     [provider]    Physical Exam: Vitals:   08/20/17 2200 08/20/17 2215 08/20/17 2230 08/20/17 2245  BP: (!) 69/47 (!) 88/75 (!) 58/38 (!) 67/52  Pulse: (!) 105  (!) 106 (!) 103  Resp: (!) 22 (!) 25 (!) 21 18  Temp:      TempSrc:      SpO2: 99%  97% 97%  Weight:      Height:       Exam limited by patient's mental status Constitutional: lethargic, pleasantly confused Vitals:   08/20/17 2200 08/20/17 2215 08/20/17 2230 08/20/17 2245  BP: (!) 69/47 (!) 88/75 (!) 58/38 (!) 67/52  Pulse: (!) 105  (!) 106 (!) 103  Resp: (!) 22 (!) 25 (!) 21 18  Temp:      TempSrc:      SpO2: 99%  97% 97%  Weight:      Height:       Eyes: pupils equal round and minimally reactive to light, yellowish exudate on both eyelids ENMT: Mucous membranes are dry,   Neck: normal, supple, Respiratory: clear to  auscultation bilaterally- anteriorly, no wheezing, no crackles. Normal respiratory effort. No accessory muscle use.  Cardiovascular: tachycardic rate and rhythm, no murmurs / rubs / gallops. 1+ bilateral pitting pedal edema Abdomen: no tenderness, no masses palpated. No hepatosplenomegaly. Bowel sounds positive.  Musculoskeletal: no clubbing / cyanosis.  Skin: no rashes, lesions, ulcers. No induration Neurologic: no obvious cranial nerve deficits. Exam limited by patient's mental status.  Psychiatric: lethargic confused  Labs on Admission: I have personally reviewed following labs and imaging studies  CBC:  Recent Labs Lab 08/20/17 1849  WBC 18.4*  NEUTROABS 15.8*  HGB 12.2  HCT 36.0  MCV 97.8  PLT 306   Coagulation Profile:  Recent Labs Lab 08/20/17 1849  INR 2.52   Urine analysis:    Component Value Date/Time   COLORURINE YELLOW 08/20/2017 2219   APPEARANCEUR CLEAR 08/20/2017 2219   LABSPEC 1.015 08/20/2017  2219   PHURINE 5.5 08/20/2017 2219   GLUCOSEU NEGATIVE 08/20/2017 2219   HGBUR NEGATIVE 08/20/2017 2219   BILIRUBINUR NEGATIVE 08/20/2017 2219   KETONESUR NEGATIVE 08/20/2017 2219   PROTEINUR NEGATIVE 08/20/2017 2219   UROBILINOGEN 0.2 03/30/2008 1359   NITRITE NEGATIVE 08/20/2017 2219   LEUKOCYTESUR TRACE (A) 08/20/2017 2219    Radiological Exams on Admission: Ct Abdomen Pelvis Wo Contrast  Result Date: 08/20/2017 CLINICAL DATA:  Hypotension EXAM: CT CHEST, ABDOMEN AND PELVIS WITHOUT CONTRAST TECHNIQUE: Multidetector CT imaging of the chest, abdomen and pelvis was performed following the standard protocol without IV contrast. COMPARISON:  Radiograph 08/20/2017, CT chest 08/03/2017, CT abdomen pelvis 04/02/2008 FINDINGS: CT CHEST FINDINGS Cardiovascular: Limited evaluation without intravenous contrast. Aortic atherosclerosis. No aneurysmal dilatation. Coronary artery calcifications. Normal heart size. No pericardial effusion. Mediastinum/Nodes: Hypodense thyroid  nodules measuring up to 11 mm on the right. Trachea unremarkable. No significantly enlarged mediastinal lymph nodes. Esophagus within normal limits. Lungs/Pleura: Small moderate bilateral pleural effusions left greater than right, similar compared to prior. Passive atelectasis within both lower lobes. Negative for pneumothorax. Minimal emphysematous disease in the upper lobes. Musculoskeletal: No acute or suspicious bone lesion. Suspect artifact causing false appearance of sternal step-off deformity. CT ABDOMEN PELVIS FINDINGS Hepatobiliary: Calcified granuloma. No calcified gallstones. No biliary dilatation. Pancreas: Atrophic pancreas.  No surrounding inflammation Spleen: Normal in size without focal abnormality. Adrenals/Urinary Tract: Adrenal glands are within normal limits. Negative for hydronephrosis. Subcentimeter hyperdense nodule anterior lower pole left kidney. Bladder decompressed Stomach/Bowel: The stomach is nonenlarged. No dilated small bowel. Possible mild wall thickening of distal small bowel within a ventral hernia. No colon wall thickening. Large retained feces in the rectum. Postsurgical changes in the right lower quadrant. Scattered colon diverticula. Vascular/Lymphatic: Densely calcified aorta. No significantly enlarged lymph nodes. IVC filter at L2-L3 level. Reproductive: Uterus and bilateral adnexa are unremarkable. Other: Negative for free air or free fluid. Right inguinal hernia containing bowel. Moderate infraumbilical ventral hernia containing fat and distal small bowel. Musculoskeletal: Kyphosis of the spine with degenerative changes. Osteopenia. No acute or suspicious bone lesion. Status post left femoral intramedullary rod with associated artifact IMPRESSION: 1. Small to moderate pleural effusions with passive atelectasis within both lower lobes. Minimal emphysematous disease of the upper lobes. 2. Stable hypodense nodules in the thyroid 3. Negative for free air. Slightly thickened  appearing distal small bowel partially contained within a ventral hernia, findings could relate to infectious, inflammatory, or ischemic bowel disease. No pneumatosis or free air. 4. Right inguinal and ventral hernias containing bowel but no evidence for obstruction. Large retained feces in the rectum. Electronically Signed   By: Jasmine Pang M.D.   On: 08/20/2017 23:49   Dg Chest 2 View  Result Date: 08/20/2017 CLINICAL DATA:  Shortness of breath and hypotension EXAM: CHEST  2 VIEW COMPARISON:  CT 08/03/2017, radiograph 08/03/2017 FINDINGS: Bilateral pleural effusions, left greater than right with atelectasis or infiltrate at the left lung base. Possible loculated appearance of fluid posteriorly on the lateral image. Stable cardiomediastinal silhouette with atherosclerosis. No pneumothorax. IMPRESSION: Bilateral pleural effusions, left greater than right similar compared to prior with adjacent left basilar atelectasis or infiltrate. Possible loculation of pleural fluid posteriorly on the lateral view. Electronically Signed   By: Jasmine Pang M.D.   On: 08/20/2017 18:34   Ct Chest Wo Contrast  Result Date: 08/20/2017 CLINICAL DATA:  Hypotension EXAM: CT CHEST, ABDOMEN AND PELVIS WITHOUT CONTRAST TECHNIQUE: Multidetector CT imaging of the chest, abdomen and pelvis was performed  following the standard protocol without IV contrast. COMPARISON:  Radiograph 08/20/2017, CT chest 08/03/2017, CT abdomen pelvis 04/02/2008 FINDINGS: CT CHEST FINDINGS Cardiovascular: Limited evaluation without intravenous contrast. Aortic atherosclerosis. No aneurysmal dilatation. Coronary artery calcifications. Normal heart size. No pericardial effusion. Mediastinum/Nodes: Hypodense thyroid nodules measuring up to 11 mm on the right. Trachea unremarkable. No significantly enlarged mediastinal lymph nodes. Esophagus within normal limits. Lungs/Pleura: Small moderate bilateral pleural effusions left greater than right, similar  compared to prior. Passive atelectasis within both lower lobes. Negative for pneumothorax. Minimal emphysematous disease in the upper lobes. Musculoskeletal: No acute or suspicious bone lesion. Suspect artifact causing false appearance of sternal step-off deformity. CT ABDOMEN PELVIS FINDINGS Hepatobiliary: Calcified granuloma. No calcified gallstones. No biliary dilatation. Pancreas: Atrophic pancreas.  No surrounding inflammation Spleen: Normal in size without focal abnormality. Adrenals/Urinary Tract: Adrenal glands are within normal limits. Negative for hydronephrosis. Subcentimeter hyperdense nodule anterior lower pole left kidney. Bladder decompressed Stomach/Bowel: The stomach is nonenlarged. No dilated small bowel. Possible mild wall thickening of distal small bowel within a ventral hernia. No colon wall thickening. Large retained feces in the rectum. Postsurgical changes in the right lower quadrant. Scattered colon diverticula. Vascular/Lymphatic: Densely calcified aorta. No significantly enlarged lymph nodes. IVC filter at L2-L3 level. Reproductive: Uterus and bilateral adnexa are unremarkable. Other: Negative for free air or free fluid. Right inguinal hernia containing bowel. Moderate infraumbilical ventral hernia containing fat and distal small bowel. Musculoskeletal: Kyphosis of the spine with degenerative changes. Osteopenia. No acute or suspicious bone lesion. Status post left femoral intramedullary rod with associated artifact IMPRESSION: 1. Small to moderate pleural effusions with passive atelectasis within both lower lobes. Minimal emphysematous disease of the upper lobes. 2. Stable hypodense nodules in the thyroid 3. Negative for free air. Slightly thickened appearing distal small bowel partially contained within a ventral hernia, findings could relate to infectious, inflammatory, or ischemic bowel disease. No pneumatosis or free air. 4. Right inguinal and ventral hernias containing bowel but no  evidence for obstruction. Large retained feces in the rectum. Electronically Signed   By: Jasmine Pang M.D.   On: 08/20/2017 23:49   EKG: Low voltage, Sinus Tachycardia.  Assessment/Plan Principal Problem:   Hypotension Active Problems:   Essential hypertension   Congestive heart failure (HCC)   Crohn disease (HCC)   DVT (deep venous thrombosis) (HCC)  Hypotension- differentials sepsis Vs PE. Patient meets criteria for sepsis. Temperature 100.9 at SNF, [facility]. Lactic acid trending up. 4L IVF in ED. No echo on file, history of CHF. UA- many bacteria. Chest CT and abdominal CT- small to moderate pleural effusion, slightly thickened distal small bowel likely related to Crohns dx. IVC filter present. - initially planned admission to tele, but patient was not be accepted due to floor with low blood pressure, ICU/step down would not also take patient as patient is not on pressors. Patient was therefore admitted to MedSurg. - IV 500 normal saline bolus - continue IV fluid normal saline at 100cc/hr X 12 hrs. - nothing by mouth - CMP STILL pending, called ED twice to ask why still pending, repeat has been ordered stat, was told they will draw it immediately.  - IV Lovenox for DVT/possible PE therapeutic dose- to limit IV blood draws- as patient is a hard stick. Switch to heparin if GFR less than 30. - follow-up blood cultures and urine cultures drawn in ED - Palliative care consult in a.m. - continue broad-spectrum antibiotics vancomycin, to switch his Zosyn to meropenem, considering severity of  illness and as patient is high-risk for drug-resistant organisms.  Metabolic encephalopathy- likely due to ongoing sepsis/severe hypotension. - iv antibiotics, iv fluids,   DVT- started on Rivaroxaban 08/06/17- from what I can understand from patient's medication list from nursing home. Patient has IVC in situ seen on CT abdomen and pelvis.  - IV Lovenox therapeutic DVT dose.  Crohn's disease- patient's  grandnephew Scott, is not aware of any bowel symptoms. CT abdomen and pelvis shows slightly thickened distal small bowel.  CHF- no echo on file. Patient is on 80 mg of Lasix daily. 1+ pitting edema bilateral lower extremities. At-risk fluid overload. - IV fluids for now with septic picture, worsening Lactic acidosis.  Eye discharge- bilaterally. Yellowish exudate on eyelids. Without conjunctival injection. - Erythromycin ophthalmic ointment  Dementia- the patient's HCPOA. He is not aware of the diagnosis of dementia. Reports patient is pretty sharp, reads, colors, does crossword puzzles, fluid conversations. She is wheelchair bound  DVT prophylaxis: Lovenox  Code Status: DNR  Family Communication: Talked to patients Grand nephew, [name], who is patient's HCPOA, and explaining patient is critically ill. Significant of fluids administered have not improved her blood pressure. He understands she is very ill, but her conference had DO NOT RESUSCITATE status. No pressors. Once on the IV fluids and antibiotics. He would like to be updated daily on patients progress. [phone]. Disposition Plan: to be determined Consults called: none, palliative care a.m Admission status: Inpt, medsurg.  Onnie Boer MD [facility] Pager [phone]  If 2AM-7AM, please contact night-coverage [email or web address] Password [email or web address]  08/21/2017, 1:14 AM

## 2017-08-21 NOTE — Progress Notes (Signed)
[facility] PROGRESS NOTE  [name] BUL:[id number] DOB: 1926/03/30 DOA: 08/20/2017  PCP: Leonard Downing, MD  Brief History/Interval Summary: 83 year old Caucasian female with a past medical history of hypertension, congestive heart failure, recent DVT on anticoagulation, Crohn's disease, frequent UTIs, peripheral vascular disease, who lives in a skilled nursing facility. She was brought into the emergency department due to weakness, shortness of breath and fever. She was found to be hypotensive on examination. She had elevated lactic acid level along with leukocytosis. There was concern for sepsis, possibly from a urinary source. She was given broad-spectrum antibiotics. Discussions were held with the patient's healthcare power of attorney. She was made DO NOT RESUSCITATE. No aggressive measures.  Reason for Visit: Hypotension, likely sepsis  Consultants: Palliative medicine  Procedures: None  Antibiotics: Vancomycin and meropenem  Subjective/Interval History: Patient is lethargic but arousable at this time. Appears to be confused. Unable to obtain much information from her.  ROS: Unable to do due to her lethargy  Objective:  Vital Signs  Vitals:   08/21/17 0500 08/21/17 0623 08/21/17 0746 08/21/17 0936  BP: (!) 65/46 (!) 81/51 (!) 120/95 (!) 76/49  Pulse:      Resp:  11 (!) 23 16  Temp:      TempSrc:      SpO2:      Weight:      Height:        Intake/Output Summary (Last 24 hours) at 08/21/17 1032 Last data filed at 08/20/17 2224  Gross per 24 hour  Intake             1450 ml  Output              120 ml  Net             1330 ml   Filed Weights   08/20/17 1821  Weight: 65.8 kg (145 lb)    General appearance: Appears to be lethargic but easily arousable. Somewhat fatigued. Head: Normocephalic, without obvious abnormality, atraumatic Resp: Diminished air entry at the bases. No definite crackles or wheezing. Cardio: regular rate and rhythm,  S1, S2 normal, no murmur, click, rub or gallop GI: soft, non-tender; bowel sounds normal; no masses,  no organomegaly Extremities: Cool extremities with some mottling Neurologic: Lethargic. Unable to test orientation. Moving her upper and lower extremities. No obvious focal neurological deficits noted.  Lab Results:  Data Reviewed: I have personally reviewed following labs and imaging studies  CBC:  Recent Labs Lab 08/20/17 1849  WBC 18.4*  NEUTROABS 15.8*  HGB 12.2  HCT 36.0  MCV 97.8  PLT 321    Basic Metabolic Panel:  Recent Labs Lab 08/21/17 0512  NA 131*  K 5.4*  CL 102  CO2 20*  GLUCOSE 96  BUN 22*  CREATININE 0.97  CALCIUM 8.7*    GFR: Estimated Creatinine Clearance: 33.3 mL/min (by C-G formula based on SCr of 0.97 mg/dL).  Liver Function Tests:  Recent Labs Lab 08/21/17 0512  AST 41  ALT 38  ALKPHOS 95  BILITOT 0.9  PROT 3.9*  ALBUMIN 1.4*     Coagulation Profile:  Recent Labs Lab 08/20/17 1849  INR 2.52    Radiology Studies: Ct Abdomen Pelvis Wo Contrast  Result Date: 08/20/2017 CLINICAL DATA:  Hypotension EXAM: CT CHEST, ABDOMEN AND PELVIS WITHOUT CONTRAST TECHNIQUE: Multidetector CT imaging of the chest, abdomen and pelvis was performed following the standard protocol without IV contrast. COMPARISON:  Radiograph 08/20/2017, CT chest 08/03/2017, CT abdomen  pelvis 04/02/2008 FINDINGS: CT CHEST FINDINGS Cardiovascular: Limited evaluation without intravenous contrast. Aortic atherosclerosis. No aneurysmal dilatation. Coronary artery calcifications. Normal heart size. No pericardial effusion. Mediastinum/Nodes: Hypodense thyroid nodules measuring up to 11 mm on the right. Trachea unremarkable. No significantly enlarged mediastinal lymph nodes. Esophagus within normal limits. Lungs/Pleura: Small moderate bilateral pleural effusions left greater than right, similar compared to prior. Passive atelectasis within both lower lobes. Negative for  pneumothorax. Minimal emphysematous disease in the upper lobes. Musculoskeletal: No acute or suspicious bone lesion. Suspect artifact causing false appearance of sternal step-off deformity. CT ABDOMEN PELVIS FINDINGS Hepatobiliary: Calcified granuloma. No calcified gallstones. No biliary dilatation. Pancreas: Atrophic pancreas.  No surrounding inflammation Spleen: Normal in size without focal abnormality. Adrenals/Urinary Tract: Adrenal glands are within normal limits. Negative for hydronephrosis. Subcentimeter hyperdense nodule anterior lower pole left kidney. Bladder decompressed Stomach/Bowel: The stomach is nonenlarged. No dilated small bowel. Possible mild wall thickening of distal small bowel within a ventral hernia. No colon wall thickening. Large retained feces in the rectum. Postsurgical changes in the right lower quadrant. Scattered colon diverticula. Vascular/Lymphatic: Densely calcified aorta. No significantly enlarged lymph nodes. IVC filter at L2-L3 level. Reproductive: Uterus and bilateral adnexa are unremarkable. Other: Negative for free air or free fluid. Right inguinal hernia containing bowel. Moderate infraumbilical ventral hernia containing fat and distal small bowel. Musculoskeletal: Kyphosis of the spine with degenerative changes. Osteopenia. No acute or suspicious bone lesion. Status post left femoral intramedullary rod with associated artifact IMPRESSION: 1. Small to moderate pleural effusions with passive atelectasis within both lower lobes. Minimal emphysematous disease of the upper lobes. 2. Stable hypodense nodules in the thyroid 3. Negative for free air. Slightly thickened appearing distal small bowel partially contained within a ventral hernia, findings could relate to infectious, inflammatory, or ischemic bowel disease. No pneumatosis or free air. 4. Right inguinal and ventral hernias containing bowel but no evidence for obstruction. Large retained feces in the rectum. Electronically  Signed   By: Donavan Foil M.D.   On: 08/20/2017 23:49   Dg Chest 2 View  Result Date: 08/20/2017 CLINICAL DATA:  Shortness of breath and hypotension EXAM: CHEST  2 VIEW COMPARISON:  CT 08/03/2017, radiograph 08/03/2017 FINDINGS: Bilateral pleural effusions, left greater than right with atelectasis or infiltrate at the left lung base. Possible loculated appearance of fluid posteriorly on the lateral image. Stable cardiomediastinal silhouette with atherosclerosis. No pneumothorax. IMPRESSION: Bilateral pleural effusions, left greater than right similar compared to prior with adjacent left basilar atelectasis or infiltrate. Possible loculation of pleural fluid posteriorly on the lateral view. Electronically Signed   By: Donavan Foil M.D.   On: 08/20/2017 18:34   Ct Chest Wo Contrast  Result Date: 08/20/2017 CLINICAL DATA:  Hypotension EXAM: CT CHEST, ABDOMEN AND PELVIS WITHOUT CONTRAST TECHNIQUE: Multidetector CT imaging of the chest, abdomen and pelvis was performed following the standard protocol without IV contrast. COMPARISON:  Radiograph 08/20/2017, CT chest 08/03/2017, CT abdomen pelvis 04/02/2008 FINDINGS: CT CHEST FINDINGS Cardiovascular: Limited evaluation without intravenous contrast. Aortic atherosclerosis. No aneurysmal dilatation. Coronary artery calcifications. Normal heart size. No pericardial effusion. Mediastinum/Nodes: Hypodense thyroid nodules measuring up to 11 mm on the right. Trachea unremarkable. No significantly enlarged mediastinal lymph nodes. Esophagus within normal limits. Lungs/Pleura: Small moderate bilateral pleural effusions left greater than right, similar compared to prior. Passive atelectasis within both lower lobes. Negative for pneumothorax. Minimal emphysematous disease in the upper lobes. Musculoskeletal: No acute or suspicious bone lesion. Suspect artifact causing false appearance of sternal step-off deformity.  CT ABDOMEN PELVIS FINDINGS Hepatobiliary: Calcified  granuloma. No calcified gallstones. No biliary dilatation. Pancreas: Atrophic pancreas.  No surrounding inflammation Spleen: Normal in size without focal abnormality. Adrenals/Urinary Tract: Adrenal glands are within normal limits. Negative for hydronephrosis. Subcentimeter hyperdense nodule anterior lower pole left kidney. Bladder decompressed Stomach/Bowel: The stomach is nonenlarged. No dilated small bowel. Possible mild wall thickening of distal small bowel within a ventral hernia. No colon wall thickening. Large retained feces in the rectum. Postsurgical changes in the right lower quadrant. Scattered colon diverticula. Vascular/Lymphatic: Densely calcified aorta. No significantly enlarged lymph nodes. IVC filter at L2-L3 level. Reproductive: Uterus and bilateral adnexa are unremarkable. Other: Negative for free air or free fluid. Right inguinal hernia containing bowel. Moderate infraumbilical ventral hernia containing fat and distal small bowel. Musculoskeletal: Kyphosis of the spine with degenerative changes. Osteopenia. No acute or suspicious bone lesion. Status post left femoral intramedullary rod with associated artifact IMPRESSION: 1. Small to moderate pleural effusions with passive atelectasis within both lower lobes. Minimal emphysematous disease of the upper lobes. 2. Stable hypodense nodules in the thyroid 3. Negative for free air. Slightly thickened appearing distal small bowel partially contained within a ventral hernia, findings could relate to infectious, inflammatory, or ischemic bowel disease. No pneumatosis or free air. 4. Right inguinal and ventral hernias containing bowel but no evidence for obstruction. Large retained feces in the rectum. Electronically Signed   By: Donavan Foil M.D.   On: 08/20/2017 23:49     Medications:  Scheduled: . enoxaparin (LOVENOX) injection  1 mg/kg Subcutaneous Q12H  . erythromycin   Both Eyes Q6H   Continuous: . sodium chloride 100 mL/hr at 08/21/17  0830  . meropenem (MERREM) IV Stopped (08/21/17 9381)  . vancomycin     OFB:PZWCHENID, ondansetron **OR** ondansetron (ZOFRAN) IV  Assessment/Plan:  Principal Problem:   Hypotension Active Problems:   Essential hypertension   Congestive heart failure (HCC)   Crohn disease (HCC)   DVT (deep venous thrombosis) (HCC)    Hypotension/septic shock. Patient met criteria for sepsis with fever, leukocytosis, lactic acidosis. Source of her infection appears to be urinary tract. Imaging studies of the chest, abdomen and pelvis did not suggest any other etiology. Patient remains hypotensive. She has been given IV fluids. Based on discussions with the power of attorney she is not to be aggressively managed. No central lines. No pressors. Prognosis appears to be poor at this time. For now we will continue with broad-spectrum antibiotic. Follow up on blood cultures. Repeat lactic acid level. Palliative medicine has been consulted and we await their input.  Acute metabolic encephalopathy. Most likely due to hypotension, sepsis. Continue to monitor closely.  Recent DVT. She has an IVC filter. But she is also noted to be on Xarelto. She is currently on therapeutic doses of Lovenox.   History of Crohn's disease. CT scan did show slightly thickened distal small bowel. However, patient has not been noted to have any diarrhea. Her abdomen seems to be benign. Continue to monitor. Constipation was suggested by CT imaging studies. Hernias were seen without any obstruction.  History of congestive heart failure, unspecified No echocardiogram on file. She is noted to be on furosemide as outpatient. This has been held due to her low blood pressure. She does have edema bilateral lower extremities. Continue to monitor for now.  Eye discharge Continue ophthalmic ointment.  Questionable history of dementia. Unclear if the patient does have dementia or not. According to the healthcare power of attorney she is  usually pretty sharp mentally. Continue to monitor for now.  DVT Prophylaxis: Currently on Lovenox    Code Status: DO NOT RESUSCITATE  Family Communication: No family at bedside  Disposition Plan: Management as outlined above. Await palliative medicine input. Prognosis seems poor.    LOS: 0 days   [facility] Pager [phone] 08/21/2017, 10:32 AM  If 7PM-7AM, please contact night-coverage at [email or web address], password [facility]

## 2017-08-21 NOTE — ED Notes (Addendum)
Lactic acid 3.9-hospitalist to be paged

## 2017-08-21 NOTE — Progress Notes (Addendum)
ANTICOAGULATION CONSULT NOTE - Follow-up  Pharmacy Consult for Lovenox Indication: DVT  Allergies  Allergen Reactions  . Hydrochlorothiazide     Unknown reaction - No known allergies per [facility]     Patient Measurements: Height: 5' (152.4 cm) Weight: 145 lb (65.8 kg) IBW/kg (Calculated) : 45.5   Vital Signs: BP: 76/49 (08/27 0936)  Labs:  Recent Labs  08/20/17 1849 08/21/17 0512 08/21/17 0830  HGB 12.2  --   --   HCT 36.0  --   --   PLT 306  --   --   LABPROT 27.7*  --   --   INR 2.52  --   --   CREATININE  --  0.97 1.10*    Estimated Creatinine Clearance: 29.3 mL/min (A) (by C-G formula based on SCr of 1.1 mg/dL (H)).   Medical History: Past Medical History:  Diagnosis Date  . Acute venous embolism and thrombosis of deep vessels of distal lower extremity (HCC)   . Allergic rhinitis, cause unspecified   . Anemia, unspecified   . Crohn's disease (HCC)   . Diarrhea   . Esophageal reflux   . Hypertension   . Infection with microorganisms resistant to other specified drugs without mention of resistance to multiple drugs   . Intestinal infection due to Clostridium difficile   . Intestinovesical fistula   . Oliguria and anuria   . Osteoarthrosis, unspecified whether generalized or localized, unspecified site   . Osteoporosis, unspecified   . Peripheral vascular disease, unspecified (HCC)   . Pneumonia, organism unspecified(486)   . Senile dementia, uncomplicated   . Sepsis(995.91)   . Unspecified constipation   . Unspecified venous (peripheral) insufficiency   . Urinary tract infection, site not specified     Medications:  Scheduled:  . enoxaparin (LOVENOX) injection  1 mg/kg Subcutaneous Q12H  . erythromycin   Both Eyes Q6H   Infusions:  . sodium chloride 100 mL/hr at 08/21/17 0830  . meropenem (MERREM) IV Stopped (08/21/17 7035)  . vancomycin      Assessment: 43 yoF admitted with SOB and hypotension on Xarelto PTA for recent DVT.  MD changing to  Lovenox per Rx.  LD xarelto 8/26 at 0800.  Today, 08/21/2017  Renal: SCr increased 0.97 to 1.10 on repeat labs. This results in a CrCl just under 39ml/min  Goal of Therapy:  Heparin level 0.3-0.7 units/ml Anti-Xa level 0.6-1 units/ml 4hrs after LMWH dose given    Plan:   Based on most recent labs (SCr), change Lovenox 65mg  to q24h, next dose due 8/28 am  F/u SCr adjust if needed  CBC at least q72h while on LMWH in hospital  Also adjust vancomycin to 750mg  IV q24h for change in renal function.  Watch SCr closely  [name], PharmD, BCPS.   Pager: [phone] 08/21/2017 11:56 AM

## 2017-08-21 NOTE — ED Notes (Signed)
Robert EMT attempted to draw labs, no success.

## 2017-08-21 NOTE — Progress Notes (Signed)
Rapid Response Event Note  Overview: Called by bedside nurse @ 1024 for hypotension and tachycardia. Patient admitted on 8/26 with hypotension r/t septic shock. DNR      Initial Focused Assessment: Patient asleep but easily aroused. Systolic bp 56 on initial assessment. Order given by Merdis Delay, NP for additional bolus over 1 hour. BP rechecked 80/52 (62), HR 112. Per bedside nurse report and progress note from rounding physician today, no aggressive treatment will be started (no central lines or pressors). Blood pressure trends reviewed and patient has been hypotensive (50-80s systolic) since admission and throughout the day (8/27)     Interventions: 500 bolus given per order. Blood pressure responding.   Plan of Care (if not transferred): Patient remains on floor. RRT available to beside nurse as needed.   Event Summary:  08/21/17 at  2224         [name]

## 2017-08-21 NOTE — Evaluation (Signed)
Clinical/Bedside Swallow Evaluation Patient Details  Name: YUNIQUE COULSON MRN: [id number] Date of Birth: 1926-02-19  Today's Date: 08/21/2017 Time: SLP Start Time (ACUTE ONLY): 1410 SLP Stop Time (ACUTE ONLY): 1429 SLP Time Calculation (min) (ACUTE ONLY): 19 min  Past Medical History:  Past Medical History:  Diagnosis Date  . Acute venous embolism and thrombosis of deep vessels of distal lower extremity (HCC)   . Allergic rhinitis, cause unspecified   . Anemia, unspecified   . Crohn's disease (HCC)   . Diarrhea   . Esophageal reflux   . Hypertension   . Infection with microorganisms resistant to other specified drugs without mention of resistance to multiple drugs   . Intestinal infection due to Clostridium difficile   . Intestinovesical fistula   . Oliguria and anuria   . Osteoarthrosis, unspecified whether generalized or localized, unspecified site   . Osteoporosis, unspecified   . Peripheral vascular disease, unspecified (HCC)   . Pneumonia, organism unspecified(486)   . Senile dementia, uncomplicated   . Sepsis(995.91)   . Unspecified constipation   . Unspecified venous (peripheral) insufficiency   . Urinary tract infection, site not specified    Past Surgical History:  Past Surgical History:  Procedure Laterality Date  . EYE SURGERY     Cataract removal   HPI:  83 year old Caucasian female with a past medical history of hypertension, congestive heart failure, recent DVT on anticoagulation, Crohn's disease, frequent UTIs, peripheral vascular disease, who lives in a skilled nursing facility. She was brought into the emergency department due to weakness, shortness of breath and fever. Pt was hypotensive and had leukocytosis and elevated lactic acid.  There was concern for sepsis, possibly from a urinary source per MD note.  She was made DO NOT RESUSCITATE per MD note.    Assessment / Plan / Recommendation Clinical Impression  Pt only allowed HOB elevated minimally - to  45*.  Mild oral transiting issues/delayed with minimal oral residuals of hard solids.  No indication of pharyngeal deficits or airway compromise even with pt consecutively swallowing approximately 3 ounces of water.     Recommend dys3/thin diet with general precautions due to pt's oral deficits.  No SLP follow up indicated regarding swallowing.   Left amplifier for pt to use during hospital stay due to her hearing loss.  Advised NT and secretary of need to have amplifier returned to rehab dept or left at nurses station for [place] to retreive upon pt discharge.   SLP Visit Diagnosis: Dysphagia, oral phase (R13.11)    Aspiration Risk  Mild aspiration risk    Diet Recommendation Dysphagia 3 (Mech soft);Thin liquid   Liquid Administration via: Cup;Straw Medication Administration:  (as tolerated) Supervision: Patient able to self feed (set up assist) Compensations: Slow rate;Small sips/bites    Other  Recommendations Oral Care Recommendations: Oral care BID   Follow up Recommendations None      Frequency and Duration   n/a         Prognosis   n/a     Swallow Study   General Date of Onset: 08/21/17 HPI: 83 year old Caucasian female with a past medical history of hypertension, congestive heart failure, recent DVT on anticoagulation, Crohn's disease, frequent UTIs, peripheral vascular disease, who lives in a skilled nursing facility. She was brought into the emergency department due to weakness, shortness of breath and fever. Pt was hypotensive and had leukocytosis and elevated lactic acid.  There was concern for sepsis, possibly from a urinary source per MD note.  She was made DO NOT RESUSCITATE per MD note.  Type of Study: Bedside Swallow Evaluation Previous Swallow Assessment: none in EPIC, pt on regular diet at SNF Diet Prior to this Study: NPO Temperature Spikes Noted: No Respiratory Status: Nasal cannula History of Recent Intubation: No Behavior/Cognition: Alert;Cooperative;Other  (Comment) (used amplifer from department due to pt's hearing loss ) Oral Care Completed by SLP: No Oral Cavity - Dentition: Dentures, top;Dentures, bottom Vision: Functional for self-feeding Self-Feeding Abilities: Able to feed self Patient Positioning: Partially reclined (pt would only allow HOB elevated to 45 degrees) Baseline Vocal Quality: Normal Volitional Cough: Strong Volitional Swallow: Unable to elicit    Oral/Motor/Sensory Function Overall Oral Motor/Sensory Function: Generalized oral weakness   Ice Chips Ice chips: Not tested   Thin Liquid Thin Liquid: Within functional limits Presentation: Self Fed;Straw;Cup    Nectar Thick Nectar Thick Liquid: Not tested   Honey Thick Honey Thick Liquid: Not tested   Puree Puree: Within functional limits Presentation: Self Fed;Spoon   Solid   GO   Solid: Impaired Oral Phase Impairments: Reduced lingual movement/coordination;Impaired mastication Oral Phase Functional Implications: Impaired mastication;Prolonged oral transit        Chales Abrahams 08/21/2017,6:50 PM  Donavan Burnet, MS [facility] SLP [phone]

## 2017-08-22 DIAGNOSIS — Z7189 Other specified counseling: Secondary | ICD-10-CM

## 2017-08-22 DIAGNOSIS — G9341 Metabolic encephalopathy: Secondary | ICD-10-CM

## 2017-08-22 DIAGNOSIS — A4151 Sepsis due to Escherichia coli [E. coli]: Principal | ICD-10-CM

## 2017-08-22 DIAGNOSIS — Z515 Encounter for palliative care: Secondary | ICD-10-CM

## 2017-08-22 DIAGNOSIS — N390 Urinary tract infection, site not specified: Secondary | ICD-10-CM

## 2017-08-22 DIAGNOSIS — I824Y9 Acute embolism and thrombosis of unspecified deep veins of unspecified proximal lower extremity: Secondary | ICD-10-CM

## 2017-08-22 DIAGNOSIS — A419 Sepsis, unspecified organism: Secondary | ICD-10-CM

## 2017-08-22 LAB — BASIC METABOLIC PANEL
ANION GAP: 9 (ref 5–15)
BUN: 27 mg/dL — AB (ref 6–20)
CO2: 20 mmol/L — ABNORMAL LOW (ref 22–32)
Calcium: 8.1 mg/dL — ABNORMAL LOW (ref 8.9–10.3)
Chloride: 106 mmol/L (ref 101–111)
Creatinine, Ser: 1 mg/dL (ref 0.44–1.00)
GFR, EST AFRICAN AMERICAN: 56 mL/min — AB (ref >60)
GFR, EST NON AFRICAN AMERICAN: 48 mL/min — AB (ref >60)
Glucose, Bld: 90 mg/dL (ref 65–99)
POTASSIUM: 4.2 mmol/L (ref 3.5–5.1)
SODIUM: 135 mmol/L (ref 135–145)

## 2017-08-22 LAB — CBC
HCT: 34.3 % — ABNORMAL LOW (ref 36.0–46.0)
Hemoglobin: 11.6 g/dL — ABNORMAL LOW (ref 12.0–15.0)
MCH: 33.8 pg (ref 26.0–34.0)
MCHC: 33.8 g/dL (ref 30.0–36.0)
MCV: 100 fL (ref 78.0–100.0)
PLATELETS: 256 10*3/uL (ref 150–400)
RBC: 3.43 MIL/uL — AB (ref 3.87–5.11)
RDW: 17.3 % — ABNORMAL HIGH (ref 11.5–15.5)
WBC: 27.1 10*3/uL — AB (ref 4.0–10.5)

## 2017-08-22 LAB — GLUCOSE, CAPILLARY: GLUCOSE-CAPILLARY: 74 mg/dL (ref 65–99)

## 2017-08-22 MED ORDER — SODIUM CHLORIDE 0.9 % IV BOLUS (SEPSIS)
250.0000 mL | Freq: Once | INTRAVENOUS | Status: AC
  Administered 2017-08-22: 250 mL via INTRAVENOUS

## 2017-08-22 MED ORDER — ENSURE ENLIVE PO LIQD
237.0000 mL | Freq: Three times a day (TID) | ORAL | Status: DC
Start: 2017-08-22 — End: 2017-08-24
  Administered 2017-08-22 – 2017-08-24 (×5): 237 mL via ORAL

## 2017-08-22 MED ORDER — SODIUM CHLORIDE 0.9 % IV BOLUS (SEPSIS)
500.0000 mL | Freq: Once | INTRAVENOUS | Status: AC
  Administered 2017-08-22: 500 mL via INTRAVENOUS

## 2017-08-22 MED ORDER — ENOXAPARIN SODIUM 80 MG/0.8ML ~~LOC~~ SOLN
1.0000 mg/kg | Freq: Two times a day (BID) | SUBCUTANEOUS | Status: DC
  Administered 2017-08-22 – 2017-08-23 (×3): 65 mg via SUBCUTANEOUS
  Filled 2017-08-22 (×3): qty 0.8

## 2017-08-22 NOTE — Progress Notes (Signed)
[facility] PROGRESS NOTE  [name] NOB:[id number] DOB: 1926/11/10 DOA: 08/20/2017  PCP: Leonard Downing, MD  Brief History/Interval Summary: 83 year old Caucasian female with a past medical history of hypertension, congestive heart failure, recent DVT on anticoagulation, Crohn's disease, frequent UTIs, peripheral vascular disease, who lives in a skilled nursing facility. She was brought into the emergency department due to weakness, shortness of breath and fever. She was found to be hypotensive on examination. She had elevated lactic acid level along with leukocytosis. There was concern for sepsis, possibly from a urinary source. She was given broad-spectrum antibiotics. Discussions were held with the patient's healthcare power of attorney. She was made DO NOT RESUSCITATE. No aggressive measures.  Reason for Visit: Hypotension, likely sepsis  Consultants: Palliative medicine  Procedures: None  Antibiotics: Vancomycin and meropenem  Subjective/Interval History: She is hard of urine, hearing and uses a hearing aid. Patient is lethargic but more responsive today. Still somewhat confused and distracted.   ROS: Unable to do due to her lethargy  Objective:  Vital Signs  Vitals:   08/21/17 2246 08/22/17 0000 08/22/17 0439 08/22/17 0748  BP:  (!) 86/69 (!) 81/65 (!) 79/43  Pulse: (!) 112  (!) 101 98  Resp:   18   Temp:   98 F (36.7 C)   TempSrc:   Oral   SpO2: 92%  90%   Weight:      Height:        Intake/Output Summary (Last 24 hours) at 08/22/17 0800 Last data filed at 08/22/17 0500  Gross per 24 hour  Intake           2345.1 ml  Output                0 ml  Net           2345.1 ml   Filed Weights   08/20/17 1821  Weight: 65.8 kg (145 lb)    General appearance: Fatigued, but arousable. Resp: Diminished air entry at the bases. No definite crackles or wheezing. Cardio: S1, S2 is normal, regular. No S3, S4, rubs, murmurs or bruits GI: Abdomen is  soft. Nontender, nondistended. Bowel sounds are present. No masses or organomegaly Extremities: Cold extremities with mottling Neurologic: Lethargic. Unable to test orientation. Moving her upper and lower extremities. No obvious focal neurological deficits noted.  Lab Results:  Data Reviewed: I have personally reviewed following labs and imaging studies  CBC:  Recent Labs Lab 08/20/17 1849 08/21/17 1200 08/22/17 0347  WBC 18.4* 20.5* 27.1*  NEUTROABS 15.8*  --   --   HGB 12.2 11.0* 11.6*  HCT 36.0 32.4* 34.3*  MCV 97.8 99.4 100.0  PLT 306 269 947    Basic Metabolic Panel:  Recent Labs Lab 08/21/17 0512 08/21/17 0830 08/22/17 0347  NA 131* 135 135  K 5.4* 4.6 4.2  CL 102 101 106  CO2 20* 21* 20*  GLUCOSE 96 52* 90  BUN 22* 25* 27*  CREATININE 0.97 1.10* 1.00  CALCIUM 8.7* 8.7* 8.1*    GFR: Estimated Creatinine Clearance: 32.3 mL/min (by C-G formula based on SCr of 1 mg/dL).  Liver Function Tests:  Recent Labs Lab 08/21/17 0512  AST 41  ALT 38  ALKPHOS 95  BILITOT 0.9  PROT 3.9*  ALBUMIN 1.4*     Coagulation Profile:  Recent Labs Lab 08/20/17 1849  INR 2.52   Results for orders placed or performed during the hospital encounter of 08/20/17  Urine Culture  Status: Abnormal (Preliminary result)   Collection Time: 08/20/17 10:19 PM  Result Value Ref Range Status   Specimen Description URINE, CATHETERIZED  Final   Special Requests NONE  Final   Culture >=100,000 COLONIES/mL GRAM NEGATIVE RODS (A)  Final   Report Status PENDING  Incomplete     Radiology Studies: Ct Abdomen Pelvis Wo Contrast  Result Date: 08/20/2017 CLINICAL DATA:  Hypotension EXAM: CT CHEST, ABDOMEN AND PELVIS WITHOUT CONTRAST TECHNIQUE: Multidetector CT imaging of the chest, abdomen and pelvis was performed following the standard protocol without IV contrast. COMPARISON:  Radiograph 08/20/2017, CT chest 08/03/2017, CT abdomen pelvis 04/02/2008 FINDINGS: CT CHEST FINDINGS  Cardiovascular: Limited evaluation without intravenous contrast. Aortic atherosclerosis. No aneurysmal dilatation. Coronary artery calcifications. Normal heart size. No pericardial effusion. Mediastinum/Nodes: Hypodense thyroid nodules measuring up to 11 mm on the right. Trachea unremarkable. No significantly enlarged mediastinal lymph nodes. Esophagus within normal limits. Lungs/Pleura: Small moderate bilateral pleural effusions left greater than right, similar compared to prior. Passive atelectasis within both lower lobes. Negative for pneumothorax. Minimal emphysematous disease in the upper lobes. Musculoskeletal: No acute or suspicious bone lesion. Suspect artifact causing false appearance of sternal step-off deformity. CT ABDOMEN PELVIS FINDINGS Hepatobiliary: Calcified granuloma. No calcified gallstones. No biliary dilatation. Pancreas: Atrophic pancreas.  No surrounding inflammation Spleen: Normal in size without focal abnormality. Adrenals/Urinary Tract: Adrenal glands are within normal limits. Negative for hydronephrosis. Subcentimeter hyperdense nodule anterior lower pole left kidney. Bladder decompressed Stomach/Bowel: The stomach is nonenlarged. No dilated small bowel. Possible mild wall thickening of distal small bowel within a ventral hernia. No colon wall thickening. Large retained feces in the rectum. Postsurgical changes in the right lower quadrant. Scattered colon diverticula. Vascular/Lymphatic: Densely calcified aorta. No significantly enlarged lymph nodes. IVC filter at L2-L3 level. Reproductive: Uterus and bilateral adnexa are unremarkable. Other: Negative for free air or free fluid. Right inguinal hernia containing bowel. Moderate infraumbilical ventral hernia containing fat and distal small bowel. Musculoskeletal: Kyphosis of the spine with degenerative changes. Osteopenia. No acute or suspicious bone lesion. Status post left femoral intramedullary rod with associated artifact IMPRESSION: 1.  Small to moderate pleural effusions with passive atelectasis within both lower lobes. Minimal emphysematous disease of the upper lobes. 2. Stable hypodense nodules in the thyroid 3. Negative for free air. Slightly thickened appearing distal small bowel partially contained within a ventral hernia, findings could relate to infectious, inflammatory, or ischemic bowel disease. No pneumatosis or free air. 4. Right inguinal and ventral hernias containing bowel but no evidence for obstruction. Large retained feces in the rectum. Electronically Signed   By: Donavan Foil M.D.   On: 08/20/2017 23:49   Dg Chest 2 View  Result Date: 08/20/2017 CLINICAL DATA:  Shortness of breath and hypotension EXAM: CHEST  2 VIEW COMPARISON:  CT 08/03/2017, radiograph 08/03/2017 FINDINGS: Bilateral pleural effusions, left greater than right with atelectasis or infiltrate at the left lung base. Possible loculated appearance of fluid posteriorly on the lateral image. Stable cardiomediastinal silhouette with atherosclerosis. No pneumothorax. IMPRESSION: Bilateral pleural effusions, left greater than right similar compared to prior with adjacent left basilar atelectasis or infiltrate. Possible loculation of pleural fluid posteriorly on the lateral view. Electronically Signed   By: Donavan Foil M.D.   On: 08/20/2017 18:34   Ct Chest Wo Contrast  Result Date: 08/20/2017 CLINICAL DATA:  Hypotension EXAM: CT CHEST, ABDOMEN AND PELVIS WITHOUT CONTRAST TECHNIQUE: Multidetector CT imaging of the chest, abdomen and pelvis was performed following the standard protocol without IV contrast.  COMPARISON:  Radiograph 08/20/2017, CT chest 08/03/2017, CT abdomen pelvis 04/02/2008 FINDINGS: CT CHEST FINDINGS Cardiovascular: Limited evaluation without intravenous contrast. Aortic atherosclerosis. No aneurysmal dilatation. Coronary artery calcifications. Normal heart size. No pericardial effusion. Mediastinum/Nodes: Hypodense thyroid nodules measuring up  to 11 mm on the right. Trachea unremarkable. No significantly enlarged mediastinal lymph nodes. Esophagus within normal limits. Lungs/Pleura: Small moderate bilateral pleural effusions left greater than right, similar compared to prior. Passive atelectasis within both lower lobes. Negative for pneumothorax. Minimal emphysematous disease in the upper lobes. Musculoskeletal: No acute or suspicious bone lesion. Suspect artifact causing false appearance of sternal step-off deformity. CT ABDOMEN PELVIS FINDINGS Hepatobiliary: Calcified granuloma. No calcified gallstones. No biliary dilatation. Pancreas: Atrophic pancreas.  No surrounding inflammation Spleen: Normal in size without focal abnormality. Adrenals/Urinary Tract: Adrenal glands are within normal limits. Negative for hydronephrosis. Subcentimeter hyperdense nodule anterior lower pole left kidney. Bladder decompressed Stomach/Bowel: The stomach is nonenlarged. No dilated small bowel. Possible mild wall thickening of distal small bowel within a ventral hernia. No colon wall thickening. Large retained feces in the rectum. Postsurgical changes in the right lower quadrant. Scattered colon diverticula. Vascular/Lymphatic: Densely calcified aorta. No significantly enlarged lymph nodes. IVC filter at L2-L3 level. Reproductive: Uterus and bilateral adnexa are unremarkable. Other: Negative for free air or free fluid. Right inguinal hernia containing bowel. Moderate infraumbilical ventral hernia containing fat and distal small bowel. Musculoskeletal: Kyphosis of the spine with degenerative changes. Osteopenia. No acute or suspicious bone lesion. Status post left femoral intramedullary rod with associated artifact IMPRESSION: 1. Small to moderate pleural effusions with passive atelectasis within both lower lobes. Minimal emphysematous disease of the upper lobes. 2. Stable hypodense nodules in the thyroid 3. Negative for free air. Slightly thickened appearing distal small  bowel partially contained within a ventral hernia, findings could relate to infectious, inflammatory, or ischemic bowel disease. No pneumatosis or free air. 4. Right inguinal and ventral hernias containing bowel but no evidence for obstruction. Large retained feces in the rectum. Electronically Signed   By: Donavan Foil M.D.   On: 08/20/2017 23:49     Medications:  Scheduled: . enoxaparin (LOVENOX) injection  1 mg/kg Subcutaneous Q12H  . erythromycin   Both Eyes Q6H   Continuous: . dextrose 5 % and 0.9% NaCl 100 mL/hr at 08/21/17 1603  . meropenem (MERREM) IV 1 g (08/22/17 0713)  . vancomycin Stopped (08/21/17 2136)   [id number], ondansetron **OR** ondansetron (ZOFRAN) IV  Assessment/Plan:  Principal Problem:   Hypotension Active Problems:   Essential hypertension   Congestive heart failure (HCC)   Crohn disease (HCC)   DVT (deep venous thrombosis) (HCC)    Hypotension/septic shock. Patient met criteria for sepsis with fever, leukocytosis, lactic acidosis. Source of her infection appears to be urinary tract. Imaging studies of the chest, abdomen and pelvis did not suggest any other etiology. Patient remains hypotensive. She has been given IV fluids. Based on discussions with the power of attorney she is not to be aggressively managed. No central lines. No pressors. Prognosis is guarded. Urine cultures growing gram-negative rods. Blood cultures negative so far. Repeat lactic acid level was normal. WBC is higher. Should be okay to discontinue vancomycin. Continue meropenem until final culture results are available. Palliative medicine has been consulted and we await their input.   Acute metabolic encephalopathy. Most likely due to hypotension, sepsis. Continue to monitor closely. He is to be stable.  Recent DVT. She has an IVC filter. But she is also noted to be on  Xarelto. She is currently on therapeutic doses of Lovenox.   History of Crohn's disease. CT scan did show  slightly thickened distal small bowel. However, patient has not been noted to have any diarrhea. Her abdomen seems to be benign. Continue to monitor. Constipation was suggested by CT imaging studies. Hernias were seen without any obstruction.  History of congestive heart failure, unspecified No echocardiogram on file. She is noted to be on furosemide as outpatient. This has been held due to her low blood pressure. She does have edema bilateral lower extremities. Continue to monitor for now.  Eye discharge Continue ophthalmic ointment.  Questionable history of dementia. Unclear if the patient does have dementia or not. According to the healthcare power of attorney she is usually pretty sharp mentally. Continue to monitor for now.  DVT Prophylaxis: Currently on Lovenox    Code Status: DO NOT RESUSCITATE  Family Communication: No family at bedside. Discussed with patient's grand nephew. He is aware of guarded prognosis. Disposition Plan: Management as outlined above. Await palliative medicine input.    LOS: 1 day   [facility] Pager [phone] 08/22/2017, 8:00 AM  If 7PM-7AM, please contact night-coverage at [email or web address], password [facility]

## 2017-08-22 NOTE — Consult Note (Signed)
Consultation Note Date: 08/22/2017   Patient Name: [name]  DOB: 04/17/1926  MRN: [id number]  Age / Sex: 83 y.o., female  PCP: Leonard Downing, MD Referring Physician: Bonnielee Haff, MD  Reason for Consultation: Establishing goals of care  HPI/Patient Profile: 83 y.o. female  with past medical history of hypertension, congestive heart failure, recent DVT, Crohn's disease, history of intestinal vesical fistula, frequent UTIs, PVD, dementia (although family says she is mentally sharp and confusion possibly more related to her hearing deficit) admitted on 08/20/2017 with weakness, SOB, hypotension and found to have urosepsis. Palliative requested to assist with GOC.   Clinical Assessment and Goals of Care: I met today with [name]. She is a pleasant lady but communication is difficult as she is very hard of hearing. She seems to be aware she is in a hospital but unsure why and says she wants go "back to [facility] tomorrow." She requests ice water and warm blanket. Requests that she be left alone to rest.   I spoke with her great nephew [name] who is also her 74 and obvious is very close with his aunt. He says that they have a very small but very close family and that she was never married or had children. Discussed with [name] GOC and updated that her mentation gives me hope but that she continues to be hypotensive and with increased WBCs. Family is very realistic and they wish to give her a chance by reasonable interventions (no CVC or pressors and no transition to ICU, okay with IVF as indicated and antibiotics) but also do not want her to suffer or prolong her death if she were to further decline. We agree to continue antibiotics and that we may be limited with fluids as she is so swollen and BP not responding much to fluids. I reassured him that this is completely reasonable at this point. He is  fully aware that she this could become his aunt's terminal event and they are accepting if it comes to that. We will reassess tomorrow what makes sense and continue to discuss then.   Scott wishes to be updated with any changes and is updating and discussing her condition and plans with his family. He was appreciative of this conversation.   Primary Decision Maker HCPOA great nephew [name] (makes decisions with his brother [name])    SUMMARY OF RECOMMENDATIONS   - Continue antibiotics and supportive care with hope of improvement - NO CVC, VASOPRESSORS/INFUSIONS or transition to ICU level care - With further decline they do not sound opposed to transition to comfort care  Code Status/Advance Care Planning:  DNR   Symptom Management:   No current signs of pain  She did complain of pain in legs which are very swollen but this was alleviated by reposition.   With any pain/SOB would treat for comfort with low dose morphine 1 mg IV prn.   Palliative Prophylaxis:   Aspiration, Bowel Regimen, Delirium Protocol, Frequent Pain Assessment, Oral Care and Turn Reposition  Psycho-social/Spiritual:  Desire for further Chaplaincy support:no  Additional Recommendations: Caregiving  Support/Resources and Education on Hospice  Prognosis:   Unable to tell at this time. Next 24-48 she will declare herself and I did share this with family.   Discharge Planning: To Be Determined      Primary Diagnoses: Present on Admission: . Hypotension . Crohn disease ([place]) . Essential hypertension . DVT (deep venous thrombosis) ([place])   I have reviewed the medical record, interviewed the patient and family, and examined the patient. The following aspects are pertinent.  Past Medical History:  Diagnosis Date  . Acute venous embolism and thrombosis of deep vessels of distal lower extremity ([place])   . Allergic rhinitis, cause unspecified   . Anemia, unspecified   . Crohn's disease ([place])   . Diarrhea    . Esophageal reflux   . Hypertension   . Infection with microorganisms resistant to other specified drugs without mention of resistance to multiple drugs   . Intestinal infection due to Clostridium difficile   . Intestinovesical fistula   . Oliguria and anuria   . Osteoarthrosis, unspecified whether generalized or localized, unspecified site   . Osteoporosis, unspecified   . Peripheral vascular disease, unspecified ([place])   . Pneumonia, organism unspecified(486)   . Senile dementia, uncomplicated   . Sepsis(995.91)   . Unspecified constipation   . Unspecified venous (peripheral) insufficiency   . Urinary tract infection, site not specified    Social History   Social History  . Marital status: Single    Spouse name: N/A  . Number of children: N/A  . Years of education: N/A   Social History Main Topics  . Smoking status: Former Research scientist (life sciences)  . Smokeless tobacco: Never Used  . Alcohol use No  . Drug use: No  . Sexual activity: Not Asked   Other Topics Concern  . None   Social History Narrative  . None   No family history on file. Scheduled Meds: . enoxaparin (LOVENOX) injection  1 mg/kg Subcutaneous Q12H  . erythromycin   Both Eyes Q6H  . feeding supplement (ENSURE ENLIVE)  237 mL Oral TID BM   Continuous Infusions: . dextrose 5 % and 0.9% NaCl 100 mL/hr at 08/22/17 1246  . meropenem (MERREM) IV Stopped (08/22/17 0743)   PRN Meds:.albuterol, ondansetron **OR** ondansetron (ZOFRAN) IV Allergies  Allergen Reactions  . Hydrochlorothiazide     Unknown reaction - No known allergies per MAR    Review of Systems  Unable to perform ROS Constitutional: Positive for fatigue.       ROS limited by hearing deficit  Neurological: Positive for weakness.    Physical Exam  Constitutional: She is oriented to person, place, and time. She appears well-developed.  HENT:  Head: Normocephalic and atraumatic.  Cardiovascular: Normal rate and regular rhythm.   Pulmonary/Chest: Effort  normal. No accessory muscle usage. No tachypnea. No respiratory distress.  Abdominal: Soft. Normal appearance.  Musculoskeletal:  BLE 3+ edema  Neurological: She is alert and oriented to person, place, and time.  Nursing note and vitals reviewed.   Vital Signs: BP (!) 75/44 (BP Location: Left Arm) Comment: informed RN   Pulse 99   Temp 97.6 F (36.4 C) (Axillary)   Resp 14   Ht 5' (1.524 m)   Wt 65.8 kg (145 lb)   SpO2 90%   BMI 28.32 kg/m  Pain Assessment: 0-10   Pain Score: 0-No pain   SpO2: SpO2: 90 % O2 Device:SpO2: 90 % O2 Flow Rate: .O2  Flow Rate (L/min): 3 L/min  IO: Intake/output summary:  Intake/Output Summary (Last 24 hours) at 08/22/17 1450 Last data filed at 08/22/17 1425  Gross per 24 hour  Intake           2345.1 ml  Output                0 ml  Net           2345.1 ml    LBM: Last BM Date: 08/21/17 Baseline Weight: Weight: 65.8 kg (145 lb) Most recent weight: Weight: 65.8 kg (145 lb)     Palliative Assessment/Data: 20%     Time Total: 50 min  Greater than 50%  of this time was spent counseling and coordinating care related to the above assessment and plan.  Signed by: Vinie Sill, NP Palliative Medicine Team Pager # [phone] (M-F 8a-5p) Team Phone # [phone] (Nights/Weekends)

## 2017-08-22 NOTE — Progress Notes (Signed)
PT Cancellation Note  Patient Details Name: [name] MRN: [id number] DOB: [birth year]   Cancelled Treatment:     PT order received but eval deferred.  Pt with BP 81/65.  Will follow.   Edyth Glomb 08/22/2017, 8:19 AM

## 2017-08-22 NOTE — Progress Notes (Addendum)
Patient has not voided. Bladder scan reveals . Message sent to Dr, Rito Ehrlich to make him aware. Dr Rito Ehrlich has returned the call and has no further suggestions  at this time. Will continue to monitor output and push oral intake.

## 2017-08-22 NOTE — Progress Notes (Signed)
ANTICOAGULATION CONSULT NOTE - Follow-up  Pharmacy Consult for Lovenox Indication: DVT  Allergies  Allergen Reactions  . Hydrochlorothiazide     Unknown reaction - No known allergies per [facility]     Patient Measurements: Height: 5' (152.4 cm) Weight: 145 lb (65.8 kg) IBW/kg (Calculated) : 45.5   Vital Signs: Temp: 98 F (36.7 C) (08/28 0439) Temp Source: Oral (08/28 0439) BP: 79/43 (08/28 0748) Pulse Rate: 98 (08/28 0748)  Labs:  Recent Labs  08/20/17 1849 08/21/17 0512 08/21/17 0830 08/21/17 1200 08/22/17 0347  HGB 12.2  --   --  11.0* 11.6*  HCT 36.0  --   --  32.4* 34.3*  PLT 306  --   --  269 256  LABPROT 27.7*  --   --   --   --   INR 2.52  --   --   --   --   CREATININE  --  0.97 1.10*  --  1.00    Estimated Creatinine Clearance: 32.3 mL/min (by C-G formula based on SCr of 1 mg/dL).   Medical History: Past Medical History:  Diagnosis Date  . Acute venous embolism and thrombosis of deep vessels of distal lower extremity (HCC)   . Allergic rhinitis, cause unspecified   . Anemia, unspecified   . Crohn's disease (HCC)   . Diarrhea   . Esophageal reflux   . Hypertension   . Infection with microorganisms resistant to other specified drugs without mention of resistance to multiple drugs   . Intestinal infection due to Clostridium difficile   . Intestinovesical fistula   . Oliguria and anuria   . Osteoarthrosis, unspecified whether generalized or localized, unspecified site   . Osteoporosis, unspecified   . Peripheral vascular disease, unspecified (HCC)   . Pneumonia, organism unspecified(486)   . Senile dementia, uncomplicated   . Sepsis(995.91)   . Unspecified constipation   . Unspecified venous (peripheral) insufficiency   . Urinary tract infection, site not specified     Medications:  Scheduled:  . enoxaparin (LOVENOX) injection  1 mg/kg Subcutaneous Q12H  . erythromycin   Both Eyes Q6H   Infusions:  . dextrose 5 % and 0.9% NaCl 100 mL/hr at  08/21/17 1603  . meropenem (MERREM) IV 1 g (08/22/17 0713)  . vancomycin Stopped (08/21/17 2136)    Assessment: [name] admitted with SOB and hypotension on Xarelto PTA for recent DVT.  MD changing to Lovenox per Rx.  LD xarelto 8/26 at 0800.  Today, 08/22/2017   Renal: SCr back to baseline. This results in a CrCl > 61ml/min  CBC: Hgb stable/improved, pltc WNL   Goal of Therapy:  Heparin level 0.3-0.7 units/ml Anti-Xa level 0.6-1 units/ml 4hrs after LMWH dose given    Plan:   Based on most recent labs (SCr), change Lovenox 65mg  to q12h  F/u SCr adjust if needed  CBC at least q72h while on LMWH in hospital  [name], PharmD, BCPS.   Pager: [phone] 08/22/2017 7:59 AM

## 2017-08-22 NOTE — Progress Notes (Signed)
OT Cancellation Note  Patient Details Name: [name] MRN: [id number] DOB: [birth year]   Cancelled Treatment:    Reason Eval/Treat Not Completed: Medical issues which prohibited therapy; Pt with rapid response last night and currently with BP 79/43. Will follow up as Pt is medically ready and as schedule permits.  Marcy Siren, OT Pager [phone] 08/22/2017   [name] 08/22/2017, 7:56 AM

## 2017-08-23 DIAGNOSIS — A419 Sepsis, unspecified organism: Secondary | ICD-10-CM

## 2017-08-23 DIAGNOSIS — Z515 Encounter for palliative care: Secondary | ICD-10-CM

## 2017-08-23 DIAGNOSIS — Z7189 Other specified counseling: Secondary | ICD-10-CM

## 2017-08-23 DIAGNOSIS — N390 Urinary tract infection, site not specified: Secondary | ICD-10-CM

## 2017-08-23 DIAGNOSIS — R6521 Severe sepsis with septic shock: Secondary | ICD-10-CM

## 2017-08-23 LAB — CREATININE, SERUM
Creatinine, Ser: 1.13 mg/dL — ABNORMAL HIGH (ref 0.44–1.00)
GFR, EST AFRICAN AMERICAN: 48 mL/min — AB (ref >60)
GFR, EST NON AFRICAN AMERICAN: 42 mL/min — AB (ref >60)

## 2017-08-23 LAB — URINE CULTURE

## 2017-08-23 MED ORDER — DEXTROSE 5 % IV SOLN
1.0000 g | INTRAVENOUS | Status: DC
  Administered 2017-08-23: 1 g via INTRAVENOUS
  Filled 2017-08-23: qty 10

## 2017-08-23 MED ORDER — IPRATROPIUM-ALBUTEROL 0.5-2.5 (3) MG/3ML IN SOLN: 3.0000 mL | Freq: Two times a day (BID) | RESPIRATORY_TRACT | Status: DC

## 2017-08-23 MED ORDER — MORPHINE SULFATE (PF) 2 MG/ML IV SOLN
1.0000 mg | INTRAVENOUS | Status: DC | PRN
  Filled 2017-08-23: qty 1

## 2017-08-23 MED ORDER — LORAZEPAM 2 MG/ML IJ SOLN: 1.0000 mg | INTRAMUSCULAR | Status: DC | PRN

## 2017-08-23 NOTE — Progress Notes (Signed)
Daily Progress Note   Patient Name: [name]       Date: 08/23/2017 DOB: Sep 06, 1926  Age: 83 y.o. MRN#: [id number] Attending Physician: Baird Lyons* Primary Care Physician: Megan Downing, MD Admit Date: 08/20/2017  Reason for Consultation/Follow-up: Establishing goals of care  Subjective: [name] is awake, alert and only complaints are leg pain which was again alleviated by reposition. She is very hard of hearing.   Length of Stay: 2  Current Medications: Scheduled Meds:  . enoxaparin (LOVENOX) injection  1 mg/kg Subcutaneous Q12H  . erythromycin   Both Eyes Q6H  . feeding supplement (ENSURE ENLIVE)  237 mL Oral TID BM  . ipratropium-albuterol  3 mL Nebulization BID    Continuous Infusions: . cefTRIAXone (ROCEPHIN)  IV    . dextrose 5 % and 0.9% NaCl 100 mL/hr at 08/22/17 1246    PRN Meds: albuterol, ondansetron **OR** ondansetron (ZOFRAN) IV  Physical Exam         Constitutional: She is oriented to person, place, and time. She appears well-developed.  HENT:  Head: Normocephalic and atraumatic.  Cardiovascular: Normal rate and regular rhythm.   Pulmonary/Chest: Effort normal. No accessory muscle usage. No tachypnea. No respiratory distress.  Abdominal: Soft. Normal appearance.  Musculoskeletal:  BLE 3+ edema  Neurological: She is alert and oriented to person, place, and time.  Nursing note and vitals reviewed.  Vital Signs: BP (!) 73/59 (BP Location: Left Arm)   Pulse 62   Temp 97.7 F (36.5 C) (Oral)   Resp 12   Ht 5' (1.524 m)   Wt 65.8 kg (145 lb)   SpO2 93%   BMI 28.32 kg/m  SpO2: SpO2: 93 % O2 Device: O2 Device: Nasal Cannula O2 Flow Rate: O2 Flow Rate (L/min): 3 L/min  Intake/output summary:  Intake/Output Summary (Last  24 hours) at 08/23/17 1340 Last data filed at 08/23/17 1039  Gross per 24 hour  Intake              360 ml  Output                4 ml  Net              356 ml   LBM: Last BM Date: 08/22/17 Baseline Weight: Weight: 65.8 kg (145 lb) Most  recent weight: Weight: 65.8 kg (145 lb)       Palliative Assessment/Data: 20%      Patient Active Problem List   Diagnosis Date Noted  . Sepsis secondary to UTI ([place])   . Goals of care, counseling/discussion   . Palliative care encounter   . Hypotension 08/21/2017  . Crohn disease ([place]) 08/21/2017  . DVT (deep venous thrombosis) ([place]) 08/21/2017  . Unspecified venous (peripheral) insufficiency 04/23/2013  . Congestive heart failure ([place]) 09/18/2007  . Essential hypertension 07/30/2007  . OSTEOARTHRITIS 07/30/2007    Palliative Care Assessment & Plan   HPI: 83 y.o. female  with past medical history of hypertension, congestive heart failure, recent DVT, Crohn's disease, history of intestinal vesical fistula, frequent UTIs, PVD, dementia (although family says she is mentally sharp and confusion possibly more related to her hearing deficit) admitted on 08/20/2017 with weakness, SOB, hypotension and found to have urosepsis. Palliative requested to assist with GOC.   Assessment: I met again at [name]'s bedside. She again today asks for warm blankets and ice water. I set up her lunch for her to try and eat. No family at bedside. She asks about when she is going back to [facility] and I told her that I am unsure. She had questions about her infection and where she developed this infection (she thought this was here in the hospital) and I explained that she came to the hospital because of her infection. She is very pleasant and without complaints. Has some short term memory loss as she asked a few questions a couple times.   I attempted to reach nephew/HCPOA, Scott, and left message on his voicemail. Will await return call and will try again tomorrow  if no call back.   Recommendations/Plan:  Continue antibiotics and supportive care with hope of improvement  NO CVC, VASOPRESSORS/INFUSIONS or transition to ICU level care  With further decline they do not sound opposed to transition to comfort care  No current signs of pain  She did complain of pain in legs which are very swollen but this was alleviated by reposition.   With any pain/SOB would treat for comfort with low dose morphine 1 mg IV prn.    Code Status:  DNR  Prognosis:   < 2 weeks with progression of infection without treatment  Discharge Planning:  Recommend return to [facility] with hospice  Care plan was discussed with Dr. Maylene Roes  Thank you for allowing the Palliative Medicine Team to assist in the care of this patient.   Total Time 37mn Prolonged Time Billed  no       Greater than 50%  of this time was spent counseling and coordinating care related to the above assessment and plan.  AVinie Sill NP Palliative Medicine Team Pager # [phone](M-F 8a-5p) Team Phone # [phone](Nights/Weekends)

## 2017-08-23 NOTE — Progress Notes (Signed)
PT Cancellation Note  Patient Details Name: [name] MRN: [id number] DOB: 1926/12/06   Cancelled Treatment:    Reason Eval/Treat Not Completed: Medical issues which prohibited therapy. Palliative Care notes reviewed. Patient remains hypotensive. Will check back tomorrw for any medical changes and patient's ability to participate in mobility.    Rada Hay 08/23/2017, 8:41 AM Blanchard Kelch PT [phone]

## 2017-08-23 NOTE — Progress Notes (Signed)
OT Cancellation Note  Patient Details Name: [name] MRN: [id number] DOB: [birth year]   Cancelled Treatment:    Reason Eval/Treat Not Completed: Medical issues which prohibited therapy; Pt remains hypotensive, reviewed palliative care notes. Will continue to follow up as Pt is medically able to paricipate.  Marcy Siren, OT Pager [phone] 08/23/2017   Orlando Penner 08/23/2017, 9:18 AM

## 2017-08-23 NOTE — Clinical Social Work Note (Signed)
Clinical Social Work Assessment  Patient Details  Name: [name] MRN: [id number] Date of Birth: 1926/03/27  Date of referral:  08/23/17               Reason for consult:   (pt from facility)                Permission sought to share information with:  Family Supports Permission granted to share information::     Name::     nephew [name] [phone]  Agency::     Relationship::     Contact Information:     Housing/Transportation Living arrangements for the past 2 months:  Assisted Living Facility Source of Information:  Facility Patient Interpreter Needed:  None Criminal Activity/Legal Involvement Pertinent to Current Situation/Hospitalization:  No - Comment as needed Significant Relationships:  Other Family Members, Community Support Lives with:  Facility Resident Do you feel safe going back to the place where you live?  Yes Need for family participation in patient care:  Yes (Comment) (pt's nephew is POA and primary decision maker as pt unable to engage much)  Care giving concerns:  Pt is from [facility] ([place] where she has resided for over 10 years per nephew. Pleased with care there, no caregiving concerns.   Social Worker assessment / plan:  CSW consulted as pt is from facility- [facility].  Attempted meeting with pt however she is sleeping. Spoke with pt's POA/newphew [name] engaged with CSW via phone. Explains family very close and supportive. Are anticipating deciding on full comfort care for pt at this time.  CSW spoke with facility as well, advises if things progress and pt needs to move to skilled section with hospice at [place] they can accommodate this.  Plan: TBD- family deciding on full comfort measures, will follow.   Employment status:  Retired Health and safety inspector:  Medicare PT Recommendations:  Not assessed at this time Information / Referral to community resources:     Patient/Family's Response to care: nephew very appreciative  of care  Patient/Family's Understanding of and Emotional Response to Diagnosis, Current Treatment, and Prognosis:  Pt unable to demonstrate understanding due to decreased level of consciousness. Nephew demonstrates adequate understanding and was emotionally accepting and appropriate to situation.   Emotional Assessment Appearance:  Appears stated age Attitude/Demeanor/Rapport:   (sleeping) Affect (typically observed):   (sleeping) Orientation:   (UTA- sleeping) Alcohol / Substance use:  Not Applicable Psych involvement (Current and /or in the community):  No (Comment)  Discharge Needs  Concerns to be addressed:  Discharge Planning Concerns Readmission within the last 30 days:  No Current discharge risk:   (still assessing) Barriers to Discharge:  Continued Medical Work up   [employer], LCSW 08/23/2017, 3:36 PM  [phone]

## 2017-08-23 NOTE — Progress Notes (Addendum)
PROGRESS NOTE    [name]  KCM:[id number] DOB: [birth year] DOA: 08/20/2017 PCP: Leonard Downing, MD     Brief Narrative:  [name] is a 83 year old Caucasian female with a past medical history of hypertension, congestive heart failure, recent DVT on anticoagulation, Crohn's disease, frequent UTIs, peripheral vascular disease, who lives in a skilled nursing facility. She was brought into the emergency department due to weakness, shortness of breath and fever. She was found to be hypotensive on examination. She had elevated lactic acid level along with leukocytosis. There was concern for sepsis, possibly from a urinary source. She was given broad-spectrum antibiotics. Discussions were held with the patient's healthcare power of attorney. She was made DO NOT RESUSCITATE. No aggressive measures.  Assessment & Plan:   Principal Problem:   Hypotension Active Problems:   Essential hypertension   Congestive heart failure (H[facility])   Crohn disease (H[facility])   DVT (deep venous thrombosis) (H[facility])   Sepsis secondary to UTI ([place])   Goals of care, counseling/discussion   Palliative care encounter  Septic shock secondary to E Coli UTI, POA  -Patient met criteria for sepsis with fever, leukocytosis, lactic acidosis. Source of her infection appears to be urinary tract. Imaging studies of the chest, abdomen and pelvis did not suggest any other etiology. Patient remains hypotensive. She has been given IV fluids. Based on discussions with the power of attorney she is not to be aggressively managed. No central lines. No pressors. Prognosis is guarded.  -Urine culture showing E Coli, sensitive to rocephin -Blood cultures negative to date  -Stop antibiotics as patient now comfort care.   Acute metabolic encephalopathy -Most likely due to hypotension, sepsis. Supportive care   Recent DVT -She has an IVC filter. But she is also noted to be on Xarelto. Stop anticoagulant as patient now comfort  care.   History of Crohn's disease. -CT scan did show slightly thickened distal small bowel. However, patient has not been noted to have any diarrhea. Her abdomen seems to be benign. Continue to monitor. Constipation was suggested by CT imaging studies. Hernias were seen without any obstruction.  History of congestive heart failure, unspecified -No echocardiogram on file. She is noted to be on furosemide as outpatient. This has been held due to her low blood pressure. She does have edema bilateral lower extremities. Continue to monitor for now.  Eye discharge -Continue ophthalmic ointment.  Questionable history of dementia. -Unclear if the patient does have dementia or not. According to the healthcare power of attorney she is usually pretty sharp mentally. Continue to monitor for now.   DVT prophylaxis: None  Code Status: DNR Family Communication: No family at bedside. Spoke with grandnephew, HCPOA  Disposition Plan: Patient continues to be hypotensive despite IV fluids. Most recent blood pressure 69/55, and is oliguric despite IV fluids. She is now also very edematous with leg discomfort due to edema and positioning. Discussed in depth with healthcare power of attorney over the phone today. He did also speak with palliative care team. He and family are very realistic regarding patient's prognosis. We discussed that despite our efforts with IV antibiotics and IV fluids, patient is now developing third spacing of fluid, oliguria, as well as hypotension. Her prognosis is very poor. Discussed comfort measures, which he is in agreement with. I encouraged him to call his family as patient may pass as early as tonight. We will initiate comfort measures. Discussed with RN.    Consultants:   Palliative Care  Procedures:  None  Antimicrobials:  Anti-infectives    Start     Dose/Rate Route Frequency Ordered Stop   08/23/17 1400  cefTRIAXone (ROCEPHIN) 1 g in dextrose 5 % 50 mL IVPB   Status:  Discontinued     1 g 100 mL/hr over 30 Minutes Intravenous Every 24 hours 08/23/17 1247 08/23/17 1502   08/21/17 2000  vancomycin (VANCOCIN) IVPB 1000 mg/200 mL premix  Status:  Discontinued     1,000 mg 200 mL/hr over 60 Minutes Intravenous Every 24 hours 08/20/17 2123 08/21/17 1203   08/21/17 2000  vancomycin (VANCOCIN) IVPB 750 mg/150 ml premix  Status:  Discontinued     750 mg 150 mL/hr over 60 Minutes Intravenous Every 24 hours 08/21/17 1203 08/22/17 1029   08/21/17 0600  meropenem (MERREM) 1 g in sodium chloride 0.9 % 100 mL IVPB  Status:  Discontinued     1 g 200 mL/hr over 30 Minutes Intravenous Every 12 hours 08/21/17 0558 08/23/17 1247   08/21/17 0400  piperacillin-tazobactam (ZOSYN) IVPB 3.375 g  Status:  Discontinued     3.375 g 12.5 mL/hr over 240 Minutes Intravenous Every 8 hours 08/20/17 2123 08/21/17 0257   08/21/17 0315  meropenem (MERREM) 1 g in sodium chloride 0.9 % 100 mL IVPB  Status:  Discontinued     1 g 200 mL/hr over 30 Minutes Intravenous Every 8 hours 08/21/17 0300 08/21/17 0558   08/20/17 2030  piperacillin-tazobactam (ZOSYN) IVPB 3.375 g     3.375 g 100 mL/hr over 30 Minutes Intravenous  Once 08/20/17 2023 08/20/17 2109   08/20/17 2030  vancomycin (VANCOCIN) IVPB 1000 mg/200 mL premix     1,000 mg 200 mL/hr over 60 Minutes Intravenous  Once 08/20/17 2023 08/20/17 2105       Subjective: Patient uncomfortable and wants to be repositioned. No other complaints, denies pain.   Objective: Vitals:   08/22/17 1425 08/22/17 2259 08/23/17 0605 08/23/17 1036  BP: (!) 75/44 (!) 71/59 (!) 73/59   Pulse: 99  62   Resp: _0 Temp: 97.6 F (36.4 C) (!) 97.5 F (36.4 C) 97.7 F (36.5 C)   TempSrc: Axillary Oral Oral   SpO2: 90%  (!) 89% 93%  Weight:      Height:        Intake/Output Summary (Last 24 hours) at 08/23/17 1507 Last data filed at 08/23/17 1039  Gross per 24 hour  Intake              360 ml  Output                4 ml  Net               356 ml   Filed Weights   08/20/17 1821  Weight: 65.8 kg (145 lb)    Examination:  General exam: Appears calm  Respiratory system: Clear to auscultation. Respiratory effort normal. Cardiovascular system: S1 & S2 heard, RRR. No JVD, murmurs, rubs, gallops or clicks. +3 peripheral edema bilaterally  Gastrointestinal system: Abdomen is nondistended, soft and nontender. No organomegaly or masses felt. Normal bowel sounds heard. Central nervous system: Alert, oriented to place, nonfocal exam  Extremities: Symmetric  Skin: No rashes, lesions or ulcers  Data Reviewed: I have personally reviewed following labs and imaging studies  CBC:  Recent Labs Lab 08/20/17 1849 08/21/17 1200 08/22/17 0347  WBC 18.4* 20.5* 27.1*  NEUTROABS 15.8*  --   --   HGB 12.2 11.0* 11.6*  HCT 36.0 32.4* 34.3*  MCV 97.8 99.4 100.0  PLT 306 269 546   Basic Metabolic Panel:  Recent Labs Lab 08/21/17 0512 08/21/17 0830 08/22/17 0347 08/23/17 0341  NA 131* 135 135  --   K 5.4* 4.6 4.2  --   CL 102 101 106  --   CO2 20* 21* 20*  --   GLUCOSE 96 52* 90  --   BUN 22* 25* 27*  --   CREATININE 0.97 1.10* 1.00 1.13*  CALCIUM 8.7* 8.7* 8.1*  --    GFR: Estimated Creatinine Clearance: 28.6 mL/min (A) (by C-G formula based on SCr of 1.13 mg/dL (H)). Liver Function Tests:  Recent Labs Lab 08/21/17 0512  AST 41  ALT 38  ALKPHOS 95  BILITOT 0.9  PROT 3.9*  ALBUMIN 1.4*   No results for input(s): LIPASE, AMYLASE in the last 168 hours. No results for input(s): AMMONIA in the last 168 hours. Coagulation Profile:  Recent Labs Lab 08/20/17 1849  INR 2.52   Cardiac Enzymes: No results for input(s): CKTOTAL, CKMB, CKMBINDEX, TROPONINI in the last 168 hours. BNP (last 3 results) No results for input(s): PROBNP in the last 8760 hours. HbA1C: No results for input(s): HGBA1C in the last 72 hours. CBG:  Recent Labs Lab 08/22/17 2231  GLUCAP 74   Lipid Profile: No results for input(s):  CHOL, HDL, LDLCALC, TRIG, CHOLHDL, LDLDIRECT in the last 72 hours. Thyroid Function Tests: No results for input(s): TSH, T4TOTAL, FREET4, T3FREE, THYROIDAB in the last 72 hours. Anemia Panel: No results for input(s): VITAMINB12, FOLATE, FERRITIN, TIBC, IRON, RETICCTPCT in the last 72 hours. Sepsis Labs:  Recent Labs Lab 08/20/17 1849 08/21/17 0050 08/21/17 1200  LATICACIDVEN 3.04* 3.9* 1.6    Recent Results (from the past 240 hour(s))  Culture, blood (Routine x 2)     Status: None (Preliminary result)   Collection Time: 08/20/17  6:40 PM  Result Value Ref Range Status   Specimen Description BLOOD LEFT ANTECUBITAL  Final   Special Requests IN PEDIATRIC BOTTLE Blood Culture adequate volume  Final   Culture   Final    NO GROWTH 2 DAYS Performed at [facility], [place] [address], [place], [place] [address]    Report Status PENDING  Incomplete  Culture, blood (Routine x 2)     Status: None (Preliminary result)   Collection Time: 08/20/17  6:49 PM  Result Value Ref Range Status   Specimen Description BLOOD RIGHT ANTECUBITAL  Final   Special Requests IN PEDIATRIC BOTTLE Blood Culture adequate volume  Final   Culture   Final    NO GROWTH 2 DAYS Performed at [facility], [place] [address], [place], [place] [address]    Report Status PENDING  Incomplete  Urine Culture     Status: Abnormal   Collection Time: 08/20/17 10:19 PM  Result Value Ref Range Status   Specimen Description URINE, CATHETERIZED  Final   Special Requests NONE  Final   Culture >=100,000 COLONIES/mL ESCHERICHIA COLI (A)  Final   Report Status 08/23/2017 FINAL  Final   Organism ID, Bacteria ESCHERICHIA COLI (A)  Final      Susceptibility   Escherichia coli - MIC*    AMPICILLIN >=32 RESISTANT Resistant     CEFAZOLIN 32 INTERMEDIATE Intermediate     CEFTRIAXONE <=1 SENSITIVE Sensitive     CIPROFLOXACIN >=4 RESISTANT Resistant     GENTAMICIN <=1 SENSITIVE Sensitive     IMIPENEM <=0.25 SENSITIVE Sensitive  NITROFURANTOIN <=16 SENSITIVE Sensitive     TRIMETH/SULFA <=20 SENSITIVE Sensitive     AMPICILLIN/SULBACTAM >=32 RESISTANT Resistant     PIP/TAZO 8 SENSITIVE Sensitive     Extended ESBL NEGATIVE Sensitive     * >=100,000 COLONIES/mL ESCHERICHIA COLI       Radiology Studies: No results found.    Scheduled Meds: . erythromycin   Both Eyes Q6H  . feeding supplement (ENSURE ENLIVE)  237 mL Oral TID BM   Continuous Infusions:    LOS: 2 days    Time spent: 50 minutes   Dessa Phi, DO [facility] [email or web address] Password [email or web address] 08/23/2017, 3:07 PM

## 2017-08-24 MED ORDER — OXYCODONE HCL 15 MG PO TABS: 15.0000 mg | ORAL_TABLET | ORAL | 0 refills | Status: AC | PRN

## 2017-08-24 MED ORDER — ALPRAZOLAM 1 MG PO TABS: 1.0000 mg | ORAL_TABLET | Freq: Three times a day (TID) | ORAL | 0 refills | Status: AC | PRN

## 2017-08-24 NOTE — Discharge Summary (Signed)
Physician Discharge Summary  [name] OZD:[id number] DOB: January 22, 1926 DOA: 08/20/2017  PCP: Leonard Downing, MD  Admit date: 08/20/2017 Discharge date: 08/24/2017  Admitted From: [facility] [facility]  Disposition:  Hospice   Discharge Condition: Terminal  CODE STATUS: DNR   Diet recommendation: Regular   Brief/Interim Summary: [name] is a 83 year old Caucasian female with a past medical history of hypertension, congestive heart failure, recent DVT on anticoagulation, Crohn's disease, frequent UTIs, peripheral vascular disease, who lives in a skilled nursing facility. She was brought into the emergency department due to weakness, shortness of breath and fever. She was found to be hypotensive on examination. She had elevated lactic acid level along with leukocytosis. There was concern for sepsis, possibly from a urinary source. She was given broad-spectrum antibiotics. Discussions were held with the patient's healthcare power of attorney. She was made DO NOT RESUSCITATE. No aggressive measures. She was treated with merrem for suspected UTI. Culture returned with E Coli. Despite IVF, she continued to be very hypotensive. She then slowly developed worsening peripheral edema with third spacing of fluid. No lasix was given due to continued hypotension. After discussion with HCPOA and Palliative Care, patient was made comfort care. She is transferred to hospice.   Discharge Diagnoses:  Principal Problem:   Hypotension Active Problems:   Essential hypertension   Congestive heart failure (HCC)   Crohn disease (HCC)   DVT (deep venous thrombosis) (HCC)   Sepsis secondary to UTI ([place])   Goals of care, counseling/discussion   Palliative care encounter   Septic shock secondary to E Coli UTI, POA  -Patient met criteria for sepsis with fever, leukocytosis, lactic acidosis. Source of her infection appears to be urinary tract. Imaging studies of the chest, abdomen  and pelvis did not suggest any other etiology. Patient remains hypotensive. She has been given IV fluids. Based on discussions with the power of attorney she is not to be aggressively managed. No central lines. No pressors. Prognosis is guarded.  -Urine culture showing E Coli, sensitive to rocephin -Blood cultures negative to date  -Stop antibiotics as patient now comfort care.   Acute metabolic encephalopathy -Most likely due to hypotension, sepsis. Supportive care. Improved.   Recent DVT -She has an IVC filter. But she is also noted to be on Xarelto. Stop anticoagulant as patient now comfort care.   History of Crohn's disease. -CT scan did show slightly thickened distal small bowel. However, patient has not been noted to have any diarrhea. Her abdomen seems to be benign. Continue to monitor. Constipation was suggested by CT imaging studies. Hernias were seen without any obstruction.  History of congestive heart failure, unspecified -No echocardiogram on file. She is noted to be on furosemide as outpatient. This has been held due to her low blood pressure. She does have edema bilateral lower extremities. Continue to monitor for now. Worsening peripheral edema with third spacing of fluid.   Eye discharge -Continue ophthalmic ointment.  Questionable history of dementia. -Unclear if the patient does have dementia or not. According to the healthcare power of attorney she is usually pretty sharp mentally. Continue to monitor for now.   Discharge Instructions   Allergies as of 08/24/2017      Reactions   Hydrochlorothiazide    Unknown reaction - No known allergies per [facility]       Medication List    STOP taking these medications   acetaminophen 325 MG tablet Commonly known as:  TYLENOL   alum &  mag hydroxide-simeth 200-200-20 MG/5ML suspension Commonly known as:  MAALOX/MYLANTA   furosemide 80 MG tablet Commonly known as:  LASIX   gabapentin 300 MG capsule Commonly known  as:  NEURONTIN   Glucosamine Sulfate 1000 MG Caps   HYDROcodone-acetaminophen 5-325 MG tablet Commonly known as:  NORCO/VICODIN   ipratropium-albuterol 0.5-2.5 (3) MG/3ML Soln Commonly known as:  DUONEB   multivitamin with minerals Tabs tablet   NUTRITIONAL DRINK Liqd   omeprazole 20 MG capsule Commonly known as:  PRILOSEC   potassium chloride 10 MEQ tablet Commonly known as:  K-DUR,KLOR-CON   rivaroxaban 20 MG Tabs tablet Commonly known as:  XARELTO   Vitamin D (Ergocalciferol) 50000 units Caps capsule Commonly known as:  DRISDOL     TAKE these medications   ALPRAZolam 1 MG tablet Commonly known as:  XANAX Take 1 tablet (1 mg total) by mouth 3 (three) times daily as needed for anxiety.   oxyCODONE 15 MG immediate release tablet Commonly known as:  ROXICODONE Take 1 tablet (15 mg total) by mouth every 4 (four) hours as needed for pain.            Discharge Care Instructions        Start     Ordered   08/24/17 0000  ALPRAZolam (XANAX) 1 MG tablet  3 times daily PRN     08/24/17 0903   08/24/17 0000  oxyCODONE (ROXICODONE) 15 MG immediate release tablet  Every 4 hours PRN     08/24/17 0903      Allergies  Allergen Reactions  . Hydrochlorothiazide     Unknown reaction - No known allergies per [facility]     Consultations:  Palliative Care    Procedures/Studies: Ct Abdomen Pelvis Wo Contrast  Result Date: 08/20/2017 CLINICAL DATA:  Hypotension EXAM: CT CHEST, ABDOMEN AND PELVIS WITHOUT CONTRAST TECHNIQUE: Multidetector CT imaging of the chest, abdomen and pelvis was performed following the standard protocol without IV contrast. COMPARISON:  Radiograph 08/20/2017, CT chest 08/03/2017, CT abdomen pelvis 04/02/2008 FINDINGS: CT CHEST FINDINGS Cardiovascular: Limited evaluation without intravenous contrast. Aortic atherosclerosis. No aneurysmal dilatation. Coronary artery calcifications. Normal heart size. No pericardial effusion. Mediastinum/Nodes: Hypodense  thyroid nodules measuring up to 11 mm on the right. Trachea unremarkable. No significantly enlarged mediastinal lymph nodes. Esophagus within normal limits. Lungs/Pleura: Small moderate bilateral pleural effusions left greater than right, similar compared to prior. Passive atelectasis within both lower lobes. Negative for pneumothorax. Minimal emphysematous disease in the upper lobes. Musculoskeletal: No acute or suspicious bone lesion. Suspect artifact causing false appearance of sternal step-off deformity. CT ABDOMEN PELVIS FINDINGS Hepatobiliary: Calcified granuloma. No calcified gallstones. No biliary dilatation. Pancreas: Atrophic pancreas.  No surrounding inflammation Spleen: Normal in size without focal abnormality. Adrenals/Urinary Tract: Adrenal glands are within normal limits. Negative for hydronephrosis. Subcentimeter hyperdense nodule anterior lower pole left kidney. Bladder decompressed Stomach/Bowel: The stomach is nonenlarged. No dilated small bowel. Possible mild wall thickening of distal small bowel within a ventral hernia. No colon wall thickening. Large retained feces in the rectum. Postsurgical changes in the right lower quadrant. Scattered colon diverticula. Vascular/Lymphatic: Densely calcified aorta. No significantly enlarged lymph nodes. IVC filter at L2-L3 level. Reproductive: Uterus and bilateral adnexa are unremarkable. Other: Negative for free air or free fluid. Right inguinal hernia containing bowel. Moderate infraumbilical ventral hernia containing fat and distal small bowel. Musculoskeletal: Kyphosis of the spine with degenerative changes. Osteopenia. No acute or suspicious bone lesion. Status post left femoral intramedullary rod with associated artifact IMPRESSION: 1. Small  to moderate pleural effusions with passive atelectasis within both lower lobes. Minimal emphysematous disease of the upper lobes. 2. Stable hypodense nodules in the thyroid 3. Negative for free air. Slightly  thickened appearing distal small bowel partially contained within a ventral hernia, findings could relate to infectious, inflammatory, or ischemic bowel disease. No pneumatosis or free air. 4. Right inguinal and ventral hernias containing bowel but no evidence for obstruction. Large retained feces in the rectum. Electronically Signed   By: Donavan Foil M.D.   On: 08/20/2017 23:49   Dg Chest 2 View  Result Date: 08/20/2017 CLINICAL DATA:  Shortness of breath and hypotension EXAM: CHEST  2 VIEW COMPARISON:  CT 08/03/2017, radiograph 08/03/2017 FINDINGS: Bilateral pleural effusions, left greater than right with atelectasis or infiltrate at the left lung base. Possible loculated appearance of fluid posteriorly on the lateral image. Stable cardiomediastinal silhouette with atherosclerosis. No pneumothorax. IMPRESSION: Bilateral pleural effusions, left greater than right similar compared to prior with adjacent left basilar atelectasis or infiltrate. Possible loculation of pleural fluid posteriorly on the lateral view. Electronically Signed   By: Donavan Foil M.D.   On: 08/20/2017 18:34   Dg Chest 2 View  Result Date: 08/03/2017 CLINICAL DATA:  Tachycardia. EXAM: CHEST  2 VIEW COMPARISON:  Radiograph of April 10, 2008. FINDINGS: Stable cardiomediastinal silhouette. Atherosclerosis of thoracic aorta is noted. No pneumothorax is noted. Minimal bibasilar subsegmental atelectasis is noted. Small bilateral pleural effusions are noted. Bony thorax is unremarkable. IMPRESSION: Aortic atherosclerosis. Minimal bibasilar subsegmental atelectasis. Small bilateral pleural effusions. Electronically Signed   By: Marijo Conception, M.D.   On: 08/03/2017 09:50   Ct Chest Wo Contrast  Result Date: 08/20/2017 CLINICAL DATA:  Hypotension EXAM: CT CHEST, ABDOMEN AND PELVIS WITHOUT CONTRAST TECHNIQUE: Multidetector CT imaging of the chest, abdomen and pelvis was performed following the standard protocol without IV contrast.  COMPARISON:  Radiograph 08/20/2017, CT chest 08/03/2017, CT abdomen pelvis 04/02/2008 FINDINGS: CT CHEST FINDINGS Cardiovascular: Limited evaluation without intravenous contrast. Aortic atherosclerosis. No aneurysmal dilatation. Coronary artery calcifications. Normal heart size. No pericardial effusion. Mediastinum/Nodes: Hypodense thyroid nodules measuring up to 11 mm on the right. Trachea unremarkable. No significantly enlarged mediastinal lymph nodes. Esophagus within normal limits. Lungs/Pleura: Small moderate bilateral pleural effusions left greater than right, similar compared to prior. Passive atelectasis within both lower lobes. Negative for pneumothorax. Minimal emphysematous disease in the upper lobes. Musculoskeletal: No acute or suspicious bone lesion. Suspect artifact causing false appearance of sternal step-off deformity. CT ABDOMEN PELVIS FINDINGS Hepatobiliary: Calcified granuloma. No calcified gallstones. No biliary dilatation. Pancreas: Atrophic pancreas.  No surrounding inflammation Spleen: Normal in size without focal abnormality. Adrenals/Urinary Tract: Adrenal glands are within normal limits. Negative for hydronephrosis. Subcentimeter hyperdense nodule anterior lower pole left kidney. Bladder decompressed Stomach/Bowel: The stomach is nonenlarged. No dilated small bowel. Possible mild wall thickening of distal small bowel within a ventral hernia. No colon wall thickening. Large retained feces in the rectum. Postsurgical changes in the right lower quadrant. Scattered colon diverticula. Vascular/Lymphatic: Densely calcified aorta. No significantly enlarged lymph nodes. IVC filter at L2-L3 level. Reproductive: Uterus and bilateral adnexa are unremarkable. Other: Negative for free air or free fluid. Right inguinal hernia containing bowel. Moderate infraumbilical ventral hernia containing fat and distal small bowel. Musculoskeletal: Kyphosis of the spine with degenerative changes. Osteopenia. No  acute or suspicious bone lesion. Status post left femoral intramedullary rod with associated artifact IMPRESSION: 1. Small to moderate pleural effusions with passive atelectasis within both lower lobes. Minimal emphysematous  disease of the upper lobes. 2. Stable hypodense nodules in the thyroid 3. Negative for free air. Slightly thickened appearing distal small bowel partially contained within a ventral hernia, findings could relate to infectious, inflammatory, or ischemic bowel disease. No pneumatosis or free air. 4. Right inguinal and ventral hernias containing bowel but no evidence for obstruction. Large retained feces in the rectum. Electronically Signed   By: Donavan Foil M.D.   On: 08/20/2017 23:49   Ct Angio Chest Pe W And/or Wo Contrast  Result Date: 08/03/2017 CLINICAL DATA:  Patient being treated for bilateral DVT. Evaluate for pulmonary embolism. Hypoxia. EXAM: CT ANGIOGRAPHY CHEST WITH CONTRAST TECHNIQUE: Multidetector CT imaging of the chest was performed using the standard protocol during bolus administration of intravenous contrast. Multiplanar CT image reconstructions and MIPs were obtained to evaluate the vascular anatomy. CONTRAST:  65 mL Isovue 370 IV. COMPARISON:  04/09/2008 FINDINGS: Cardiovascular: Heart is normal size. Calcified plaque over the left anterior descending and right coronary arteries. Calcified plaque over the thoracic aorta. Pulmonary arterial system is well opacified without evidence of emboli. Mediastinum/Nodes: No evidence of mediastinal or hilar adenopathy. Remaining mediastinal structures are within normal. Couple small thyroid hypodense nodules the largest measuring 1.1 cm over the right lobe. Lungs/Pleura: Lungs are adequately inflated and demonstrate small to moderate bilateral pleural effusions with minimal associated bibasilar atelectasis improved compared to the previous exam. Airways are within normal. Upper Abdomen: Open cm cyst over the upper pole right kidney.  Calcified granuloma over the spleen and left lobe of the liver. Calcified plaque over the abdominal aorta. Musculoskeletal: Moderate degenerative change of the spine. Review of the MIP images confirms the above findings. IMPRESSION: No evidence of pulmonary embolism. Small to moderate bilateral pleural effusions left greater than right with minimal associated bibasilar atelectasis. Mild atherosclerotic coronary artery disease. Aortic Atherosclerosis (ICD10-I70.0). 1 cm right renal cyst. Two small hypodense thyroid nodules with the larger measuring 1.1 cm over the right lobe. Electronically Signed   By: Marin Olp M.D.   On: 08/03/2017 17:35       Discharge Exam: Vitals:   08/23/17 2211 08/24/17 0627  BP: (!) 76/60 (!) 73/52  Pulse: 71 (!) 106  Resp: 16 16  Temp: 98.4 F (36.9 C) 98 F (36.7 C)  SpO2: (!) 82% 98%   Vitals:   08/23/17 1036 08/23/17 1435 08/23/17 2211 08/24/17 0627  BP:  (!) 69/55 (!) 76/60 (!) 73/52  Pulse:  65 71 (!) 106  Resp:  16 16 16   Temp:  (!) 97.5 F (36.4 C) 98.4 F (36.9 C) 98 F (36.7 C)  TempSrc:  Oral Oral Oral  SpO2: 93% 96% (!) 82% 98%  Weight:      Height:        General: Pt is alert, awake, not in acute distress Cardiovascular: RRR, S1/S2 +, no rubs, no gallops Respiratory: CTA bilaterally, no wheezing, no rhonchi Abdominal: Soft, NT, ND, bowel sounds + Extremities: +3-4 edema of all extremities, bilateral finger tips with cyanosis and cool to touch CNS: alert, oriented to self and place, conversational and lucid     The results of significant diagnostics from this hospitalization (including imaging, microbiology, ancillary and laboratory) are listed below for reference.     Microbiology: Recent Results (from the past 240 hour(s))  Culture, blood (Routine x 2)     Status: None (Preliminary result)   Collection Time: 08/20/17  6:40 PM  Result Value Ref Range Status   Specimen Description BLOOD LEFT ANTECUBITAL  Final   Special  Requests IN PEDIATRIC BOTTLE Blood Culture adequate volume  Final   Culture   Final    NO GROWTH 2 DAYS Performed at [facility], [place] [address] [place], [place] [address]    Report Status PENDING  Incomplete  Culture, blood (Routine x 2)     Status: None (Preliminary result)   Collection Time: 08/20/17  6:49 PM  Result Value Ref Range Status   Specimen Description BLOOD RIGHT ANTECUBITAL  Final   Special Requests IN PEDIATRIC BOTTLE Blood Culture adequate volume  Final   Culture   Final    NO GROWTH 2 DAYS Performed at [facility], [place] [address], [place], [place] [address]    Report Status PENDING  Incomplete  Urine Culture     Status: Abnormal   Collection Time: 08/20/17 10:19 PM  Result Value Ref Range Status   Specimen Description URINE, CATHETERIZED  Final   Special Requests NONE  Final   Culture >=100,000 COLONIES/mL ESCHERICHIA COLI (A)  Final   Report Status 08/23/2017 FINAL  Final   Organism ID, Bacteria ESCHERICHIA COLI (A)  Final      Susceptibility   Escherichia coli - MIC*    AMPICILLIN >=32 RESISTANT Resistant     CEFAZOLIN 32 INTERMEDIATE Intermediate     CEFTRIAXONE <=1 SENSITIVE Sensitive     CIPROFLOXACIN >=4 RESISTANT Resistant     GENTAMICIN <=1 SENSITIVE Sensitive     IMIPENEM <=0.25 SENSITIVE Sensitive     NITROFURANTOIN <=16 SENSITIVE Sensitive     TRIMETH/SULFA <=20 SENSITIVE Sensitive     AMPICILLIN/SULBACTAM >=32 RESISTANT Resistant     PIP/TAZO 8 SENSITIVE Sensitive     Extended ESBL NEGATIVE Sensitive     * >=100,000 COLONIES/mL ESCHERICHIA COLI     Labs: BNP (last 3 results) No results for input(s): BNP in the last 8760 hours. Basic Metabolic Panel:  Recent Labs Lab 08/21/17 0512 08/21/17 0830 08/22/17 0347 08/23/17 0341  NA 131* 135 135  --   K 5.4* 4.6 4.2  --   CL 102 101 106  --   CO2 20* 21* 20*  --   GLUCOSE 96 52* 90  --   BUN 22* 25* 27*  --   CREATININE 0.97 1.10* 1.00 1.13*  CALCIUM 8.7* 8.7* 8.1*  --     Liver Function Tests:  Recent Labs Lab 08/21/17 0512  AST 41  ALT 38  ALKPHOS 95  BILITOT 0.9  PROT 3.9*  ALBUMIN 1.4*   No results for input(s): LIPASE, AMYLASE in the last 168 hours. No results for input(s): AMMONIA in the last 168 hours. CBC:  Recent Labs Lab 08/20/17 1849 08/21/17 1200 08/22/17 0347  WBC 18.4* 20.5* 27.1*  NEUTROABS 15.8*  --   --   HGB 12.2 11.0* 11.6*  HCT 36.0 32.4* 34.3*  MCV 97.8 99.4 100.0  PLT 306 269 256   Cardiac Enzymes: No results for input(s): CKTOTAL, CKMB, CKMBINDEX, TROPONINI in the last 168 hours. BNP: Invalid input(s): POCBNP CBG:  Recent Labs Lab 08/22/17 2231  GLUCAP 74   D-Dimer No results for input(s): DDIMER in the last 72 hours. Hgb A1c No results for input(s): HGBA1C in the last 72 hours. Lipid Profile No results for input(s): CHOL, HDL, LDLCALC, TRIG, CHOLHDL, LDLDIRECT in the last 72 hours. Thyroid function studies No results for input(s): TSH, T4TOTAL, T3FREE, THYROIDAB in the last 72 hours.  Invalid input(s): FREET3 Anemia work up No results for input(s):  VITAMINB12, FOLATE, FERRITIN, TIBC, IRON, RETICCTPCT in the last 72 hours. Urinalysis    Component Value Date/Time   COLORURINE YELLOW 08/20/2017 2219   APPEARANCEUR CLEAR 08/20/2017 2219   LABSPEC 1.015 08/20/2017 2219   PHURINE 5.5 08/20/2017 2219   GLUCOSEU NEGATIVE 08/20/2017 2219   HGBUR NEGATIVE 08/20/2017 2219   BILIRUBINUR NEGATIVE 08/20/2017 2219   KETONESUR NEGATIVE 08/20/2017 2219   PROTEINUR NEGATIVE 08/20/2017 2219   UROBILINOGEN 0.2 03/30/2008 1359   NITRITE NEGATIVE 08/20/2017 2219   LEUKOCYTESUR TRACE (A) 08/20/2017 2219   Sepsis Labs Invalid input(s): PROCALCITONIN,  WBC,  LACTICIDVEN Microbiology Recent Results (from the past 240 hour(s))  Culture, blood (Routine x 2)     Status: None (Preliminary result)   Collection Time: 08/20/17  6:40 PM  Result Value Ref Range Status   Specimen Description BLOOD LEFT ANTECUBITAL   Final   Special Requests IN PEDIATRIC BOTTLE Blood Culture adequate volume  Final   Culture   Final    NO GROWTH 2 DAYS Performed at [facility], [place] [address] [place], [place] [address]    Report Status PENDING  Incomplete  Culture, blood (Routine x 2)     Status: None (Preliminary result)   Collection Time: 08/20/17  6:49 PM  Result Value Ref Range Status   Specimen Description BLOOD RIGHT ANTECUBITAL  Final   Special Requests IN PEDIATRIC BOTTLE Blood Culture adequate volume  Final   Culture   Final    NO GROWTH 2 DAYS Performed at [facility], [place] [address] [place], [place] [address]    Report Status PENDING  Incomplete  Urine Culture     Status: Abnormal   Collection Time: 08/20/17 10:19 PM  Result Value Ref Range Status   Specimen Description URINE, CATHETERIZED  Final   Special Requests NONE  Final   Culture >=100,000 COLONIES/mL ESCHERICHIA COLI (A)  Final   Report Status 08/23/2017 FINAL  Final   Organism ID, Bacteria ESCHERICHIA COLI (A)  Final      Susceptibility   Escherichia coli - MIC*    AMPICILLIN >=32 RESISTANT Resistant     CEFAZOLIN 32 INTERMEDIATE Intermediate     CEFTRIAXONE <=1 SENSITIVE Sensitive     CIPROFLOXACIN >=4 RESISTANT Resistant     GENTAMICIN <=1 SENSITIVE Sensitive     IMIPENEM <=0.25 SENSITIVE Sensitive     NITROFURANTOIN <=16 SENSITIVE Sensitive     TRIMETH/SULFA <=20 SENSITIVE Sensitive     AMPICILLIN/SULBACTAM >=32 RESISTANT Resistant     PIP/TAZO 8 SENSITIVE Sensitive     Extended ESBL NEGATIVE Sensitive     * >=100,000 COLONIES/mL ESCHERICHIA COLI     Time coordinating discharge: 40 minutes  SIGNED:  Dessa Phi, DO [facility] Pager [phone]  If 7PM-7AM, please contact night-coverage [email or web address] Password [email or web address] 08/24/2017, 9:05 AM

## 2017-08-24 NOTE — Care Management Important Message (Signed)
Important Message  Patient Details  Name: KAYLE PASSARELLI MRN: [id number] Date of Birth: Nov 28, 1926   Medicare Important Message Given:  Yes    Caren Macadam 08/24/2017, 10:31 AMImportant Message  Patient Details  Name: ELLIN FITZGIBBONS MRN: [id number] Date of Birth: 11-26-1926   Medicare Important Message Given:  Yes    Caren Macadam 08/24/2017, 10:31 AM

## 2017-08-24 NOTE — [place] FL2 (Signed)
  [place] MEDICAID FL2 LEVEL OF CARE SCREENING TOOL     IDENTIFICATION  Patient Name: [name] Birthdate: May 13, 1926 Sex: female Admission Date (Current Location): 08/20/2017  [facility] and [place] Number:  Producer, television/film/video and Address:  [facility],  [address], [place] [address]      Provider Number: [id number]  Attending Physician Name and Address:  Noralee Stain Chahn-Yan*  Relative Name and Phone Number:       Current Level of Care: Hospital Recommended Level of Care: Skilled Nursing Facility Prior Approval Number:    Date Approved/Denied:   PASRR Number: [id number]  Discharge Plan: SNF    Current Diagnoses: Patient Active Problem List   Diagnosis Date Noted  . Sepsis secondary to UTI (HCC)   . Goals of care, counseling/discussion   . Palliative care encounter   . Hypotension 08/21/2017  . Crohn disease (HCC) 08/21/2017  . DVT (deep venous thrombosis) (HCC) 08/21/2017  . Unspecified venous (peripheral) insufficiency 04/23/2013  . Congestive heart failure (HCC) 09/18/2007  . Essential hypertension 07/30/2007  . OSTEOARTHRITIS 07/30/2007    Orientation RESPIRATION BLADDER Height & Weight     Self, Situation, Place  O2 (3L) External catheter, Incontinent Weight: 145 lb (65.8 kg) Height:  5' (152.4 cm)  BEHAVIORAL SYMPTOMS/MOOD NEUROLOGICAL BOWEL NUTRITION STATUS      Incontinent Diet (dysphasia III, fluid consistency thin)  AMBULATORY STATUS COMMUNICATION OF NEEDS Skin   Extensive Assist Verbally Normal                       Personal Care Assistance Level of Assistance  Bathing, Feeding, Dressing Bathing Assistance: Limited assistance Feeding assistance: Limited assistance Dressing Assistance: Limited assistance     Functional Limitations Info  Sight, Hearing, Speech Sight Info: Adequate Hearing Info: Adequate Speech Info: Adequate    SPECIAL CARE FACTORS FREQUENCY                       Contractures  Contractures Info: Not present    Additional Factors Info  Code Status, Allergies Code Status Info: DNR Allergies Info: hydrochlorothiazide           Current Medications (08/24/2017):  This is the current hospital active medication list Current Facility-Administered Medications  Medication Dose Route Frequency Provider Last Rate Last Dose  . erythromycin ophthalmic ointment   Both Eyes Q6H Emokpae, Ejiroghene E, MD      . feeding supplement (ENSURE ENLIVE) (ENSURE ENLIVE) liquid 237 mL  237 mL Oral TID BM Osvaldo Shipper, MD   237 mL at 08/23/17 2050  . LORazepam (ATIVAN) injection 1 mg  1 mg Intravenous Q1H PRN Noralee Stain Chahn-Yang, DO      . morphine 2 MG/ML injection 1-2 mg  1-2 mg Intravenous Q1H PRN Noralee Stain Chahn-Yang, DO      . ondansetron (ZOFRAN) tablet 4 mg  4 mg Oral Q6H PRN Emokpae, Ejiroghene E, MD       Or  . ondansetron (ZOFRAN) injection 4 mg  4 mg Intravenous Q6H PRN Emokpae, Ejiroghene E, MD         Discharge Medications: Please see discharge summary for a list of discharge medications.  Relevant Imaging Results:  Relevant Lab Results:   Additional Information SS [id number]. Needs Hospice services at facility  [facility], [place]

## 2017-08-24 NOTE — Clinical Social Work Placement (Signed)
Pt discharging today to transfer to [facility]- was residing in independent living at [facility] however family has decided to pursue full comfort care and pt will returning to SNF unit with hospice. Report#- [phone], room [facility] [employer] notified and family has agreed to this plan. Pt will transport via [facility]- CSW completed medical necessity form and will arrange transportation once hearing from [facility] that pt's bed is ready.   See below for placement details  CLINICAL SOCIAL WORK PLACEMENT  NOTE  Date:  08/24/2017  Patient Details  Name: [name] MRN: [id number] Date of Birth: 1926/10/16  Clinical Social Work is seeking post-discharge placement for this patient at the Skilled  Nursing Facility level of care (*CSW will initial, date and re-position this form in  chart as items are completed):  Yes   Patient/family provided with [facility] Clinical Social Work Department's list of facilities offering this level of care within the geographic area requested by the patient (or if unable, by the patient's family).  Yes   Patient/family informed of their freedom to choose among providers that offer the needed level of care, that participate in Medicare, Medicaid or managed care program needed by the patient, have an available bed and are willing to accept the patient.  Yes   Patient/family informed of [facility]'s ownership interest in [facility] and [facility], as well as of the fact that they are under no obligation to receive care at these facilities.  PASRR submitted to EDS on       PASRR number received on 08/24/17     Existing PASRR number confirmed on       FL2 transmitted to all facilities in geographic area requested by pt/family on       FL2 transmitted to all facilities within larger geographic area on       Patient informed that his/her managed care company has contracts with or will negotiate with certain facilities, including the following:         Yes   Patient/family informed of bed offers received.  Patient chooses bed at [facility] [place]     Physician recommends and patient chooses bed at [facility] [place]    Patient to be transferred to [facility] [place] on 08/24/17.  Patient to be transferred to facility by [facility]     Patient family notified on 08/24/17 of transfer.  Name of family member notified:  nephew     PHYSICIAN       Additional Comment:    _______________________________________________ Nelwyn Salisbury, LCSW 08/24/2017, 9:59 AM

## 2017-08-24 NOTE — Progress Notes (Signed)
Pt to dc back to [facility] with hospice care. Sandford Craze RN,BSN,NCM [phone]

## 2017-08-26 LAB — CULTURE, BLOOD (ROUTINE X 2)
CULTURE: NO GROWTH
CULTURE: NO GROWTH
SPECIAL REQUESTS: ADEQUATE
Special Requests: ADEQUATE

## 2017-09-25 DEATH — deceased
